# Patient Record
Sex: Male | Born: 1985
Health system: Southern US, Community
[De-identification: ages and names within clinical notes are randomized; demographics above are authoritative.]

## PROBLEM LIST (undated history)

## (undated) DIAGNOSIS — B20 Human immunodeficiency virus [HIV] disease: Secondary | ICD-10-CM

## (undated) DIAGNOSIS — I10 Essential (primary) hypertension: Secondary | ICD-10-CM

## (undated) DIAGNOSIS — J45909 Unspecified asthma, uncomplicated: Secondary | ICD-10-CM

## (undated) DIAGNOSIS — M329 Systemic lupus erythematosus, unspecified: Secondary | ICD-10-CM

## (undated) DIAGNOSIS — F419 Anxiety disorder, unspecified: Secondary | ICD-10-CM

## (undated) HISTORY — DX: Essential (primary) hypertension: I10

## (undated) HISTORY — DX: Human immunodeficiency virus (HIV) disease: B20

## (undated) HISTORY — DX: Anxiety disorder, unspecified: F41.9

---

## 2014-03-25 DIAGNOSIS — F411 Generalized anxiety disorder: Secondary | ICD-10-CM | POA: Diagnosis present

## 2017-09-12 ENCOUNTER — Other Ambulatory Visit: Payer: Self-pay

## 2017-09-12 ENCOUNTER — Encounter: Payer: Self-pay | Admitting: Emergency Medicine

## 2017-09-12 ENCOUNTER — Emergency Department: Payer: BC Managed Care – PPO

## 2017-09-12 ENCOUNTER — Emergency Department
Admission: EM | Admit: 2017-09-12 | Discharge: 2017-09-13 | Disposition: A | Discharge status: Home / Self Care | Payer: BC Managed Care – PPO | Attending: Emergency Medicine | Admitting: Emergency Medicine

## 2017-09-12 DIAGNOSIS — J45909 Unspecified asthma, uncomplicated: Secondary | ICD-10-CM | POA: Insufficient documentation

## 2017-09-12 DIAGNOSIS — M545 Low back pain, unspecified: Secondary | ICD-10-CM

## 2017-09-12 HISTORY — DX: Unspecified asthma, uncomplicated: J45.909

## 2017-09-12 MED ORDER — TRAMADOL HCL 50 MG PO TABS: 50.0000 mg | ORAL_TABLET | Freq: Four times a day (QID) | ORAL | 0 refills | Status: DC | PRN

## 2017-09-12 MED ORDER — METHOCARBAMOL 750 MG PO TABS: 1500.0000 mg | ORAL_TABLET | Freq: Four times a day (QID) | ORAL | 0 refills | Status: DC

## 2017-09-12 MED ORDER — HYDROMORPHONE HCL 1 MG/ML IJ SOLN
1.0000 mg | Freq: Once | INTRAMUSCULAR | Status: AC
  Administered 2017-09-12: 1 mg via INTRAMUSCULAR
  Filled 2017-09-12: qty 1

## 2017-09-12 MED ORDER — IBUPROFEN 800 MG PO TABS: 800.0000 mg | ORAL_TABLET | Freq: Three times a day (TID) | ORAL | 0 refills | Status: DC | PRN

## 2017-09-12 MED ORDER — KETOROLAC TROMETHAMINE 60 MG/2ML IM SOLN
60.0000 mg | Freq: Once | INTRAMUSCULAR | Status: AC
  Administered 2017-09-12: 60 mg via INTRAMUSCULAR
  Filled 2017-09-12: qty 2

## 2017-09-12 NOTE — ED Triage Notes (Signed)
Pt arrived to the ED accompanied by a friend for complaints of back, leg and arm pain. Pt reports that he injured his back at work while pulling on some heavy linen and his back pain has gotten progressively worse. Pt is AOx4 in no apparent distress.
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
## 2017-09-12 NOTE — ED Notes (Signed)
See triage note.

## 2018-01-11 ENCOUNTER — Other Ambulatory Visit: Payer: BC Managed Care – PPO

## 2018-01-11 ENCOUNTER — Ambulatory Visit: Payer: BC Managed Care – PPO

## 2018-01-11 ENCOUNTER — Other Ambulatory Visit (HOSPITAL_COMMUNITY)
Admission: RE | Admit: 2018-01-11 | Discharge: 2018-01-11 | Disposition: A | Discharge status: Home / Self Care | Payer: BC Managed Care – PPO | Source: Ambulatory Visit | Attending: Infectious Disease | Admitting: Infectious Disease

## 2018-01-11 ENCOUNTER — Other Ambulatory Visit: Payer: Self-pay | Admitting: *Deleted

## 2018-01-11 DIAGNOSIS — B20 Human immunodeficiency virus [HIV] disease: Secondary | ICD-10-CM | POA: Insufficient documentation

## 2018-01-11 DIAGNOSIS — Z79899 Other long term (current) drug therapy: Secondary | ICD-10-CM

## 2018-01-11 DIAGNOSIS — Z113 Encounter for screening for infections with a predominantly sexual mode of transmission: Secondary | ICD-10-CM | POA: Diagnosis present

## 2018-01-12 LAB — URINALYSIS
BILIRUBIN URINE: NEGATIVE
GLUCOSE, UA: NEGATIVE
HGB URINE DIPSTICK: NEGATIVE
Ketones, ur: NEGATIVE
LEUKOCYTES UA: NEGATIVE
Nitrite: NEGATIVE
PH: 6.5 (ref 5.0–8.0)
Protein, ur: NEGATIVE
Specific Gravity, Urine: 1.026 (ref 1.001–1.03)

## 2018-01-12 LAB — URINE CYTOLOGY ANCILLARY ONLY
CHLAMYDIA, DNA PROBE: NEGATIVE
Neisseria Gonorrhea: NEGATIVE

## 2018-01-12 LAB — T-HELPER CELL (CD4) - (RCID CLINIC ONLY)
CD4 T CELL HELPER: 11 % — AB (ref 33–55)
CD4 T Cell Abs: 330 /uL — ABNORMAL LOW (ref 400–2700)

## 2018-01-14 LAB — QUANTIFERON-TB GOLD PLUS
Mitogen-NIL: >10 IU/mL
NIL: 0.04 [IU]/mL
QuantiFERON-TB Gold Plus: NEGATIVE
TB1-NIL: 0 IU/mL
TB2-NIL: 0 IU/mL

## 2018-01-15 LAB — LIPID PANEL
Cholesterol: 181 mg/dL (ref <200)
HDL: 45 mg/dL (ref >40)
LDL CHOLESTEROL (CALC): 105 mg/dL — AB
Non-HDL Cholesterol (Calc): 136 mg/dL (calc) — ABNORMAL HIGH (ref <130)
TRIGLYCERIDES: 193 mg/dL — AB (ref <150)
Total CHOL/HDL Ratio: 4 (calc) (ref <5.0)

## 2018-01-15 LAB — CBC WITH DIFFERENTIAL/PLATELET
BASOS ABS: 19 {cells}/uL (ref 0–200)
Basophils Relative: 0.3 %
EOS ABS: 50 {cells}/uL (ref 15–500)
Eosinophils Relative: 0.8 %
HCT: 46 % (ref 38.5–50.0)
Hemoglobin: 15.1 g/dL (ref 13.2–17.1)
Lymphs Abs: 2802 cells/uL (ref 850–3900)
MCH: 28.4 pg (ref 27.0–33.0)
MCHC: 32.8 g/dL (ref 32.0–36.0)
MCV: 86.5 fL (ref 80.0–100.0)
MONOS PCT: 16.1 %
MPV: 12.7 fL — ABNORMAL HIGH (ref 7.5–12.5)
Neutro Abs: 2331 cells/uL (ref 1500–7800)
Neutrophils Relative %: 37.6 %
Platelets: 142 10*3/uL (ref 140–400)
RBC: 5.32 10*6/uL (ref 4.20–5.80)
RDW: 12.8 % (ref 11.0–15.0)
TOTAL LYMPHOCYTE: 45.2 %
WBC mixed population: 998 cells/uL — ABNORMAL HIGH (ref 200–950)
WBC: 6.2 10*3/uL (ref 3.8–10.8)

## 2018-01-15 LAB — HLA B*5701: HLA-B*5701 w/rflx HLA-B High: NEGATIVE

## 2018-01-15 LAB — COMPLETE METABOLIC PANEL WITH GFR
AG RATIO: 1.1 (calc) (ref 1.0–2.5)
ALBUMIN MSPROF: 4.3 g/dL (ref 3.6–5.1)
ALT: 15 U/L (ref 9–46)
AST: 17 U/L (ref 10–40)
Alkaline phosphatase (APISO): 99 U/L (ref 40–115)
BILIRUBIN TOTAL: 0.3 mg/dL (ref 0.2–1.2)
BUN: 11 mg/dL (ref 7–25)
CO2: 27 mmol/L (ref 20–32)
CREATININE: 1.08 mg/dL (ref 0.60–1.35)
Calcium: 9.7 mg/dL (ref 8.6–10.3)
Chloride: 102 mmol/L (ref 98–110)
GFR, Est African American: 106 mL/min/{1.73_m2} (ref >=60)
GFR, Est Non African American: 92 mL/min/{1.73_m2} (ref >=60)
GLOBULIN: 3.9 g/dL — AB (ref 1.9–3.7)
Glucose, Bld: 93 mg/dL (ref 65–99)
POTASSIUM: 4.4 mmol/L (ref 3.5–5.3)
SODIUM: 139 mmol/L (ref 135–146)
Total Protein: 8.2 g/dL — ABNORMAL HIGH (ref 6.1–8.1)

## 2018-01-15 LAB — HIV ANTIBODY (ROUTINE TESTING W REFLEX): HIV: REACTIVE — AB

## 2018-01-15 LAB — HEPATITIS C ANTIBODY
HEP C AB: NONREACTIVE
SIGNAL TO CUT-OFF: 0.15 (ref <1.00)

## 2018-01-15 LAB — HEPATITIS A ANTIBODY, TOTAL: HEPATITIS A AB,TOTAL: NONREACTIVE

## 2018-01-15 LAB — HEPATITIS B SURFACE ANTIBODY,QUALITATIVE: HEP B S AB: REACTIVE — AB

## 2018-01-15 LAB — RPR: RPR Ser Ql: NONREACTIVE

## 2018-01-15 LAB — HEPATITIS B SURFACE ANTIGEN: Hepatitis B Surface Ag: NONREACTIVE

## 2018-01-15 LAB — HIV-1/2 AB - DIFFERENTIATION
HIV-1 antibody: POSITIVE — AB
HIV-2 Ab: NEGATIVE

## 2018-01-15 LAB — HEPATITIS B CORE ANTIBODY, TOTAL: Hep B Core Total Ab: NONREACTIVE

## 2018-01-23 LAB — HIV-1 RNA ULTRAQUANT REFLEX TO GENTYP+
HIV 1 RNA Quant: 372000 copies/mL — ABNORMAL HIGH
HIV-1 RNA Quant, Log: 5.57 Log copies/mL — ABNORMAL HIGH

## 2018-01-23 LAB — HIV-1 GENOTYPE: HIV-1 Genotype: DETECTED — AB

## 2018-01-25 ENCOUNTER — Ambulatory Visit (INDEPENDENT_AMBULATORY_CARE_PROVIDER_SITE_OTHER): Payer: BC Managed Care – PPO | Admitting: Infectious Diseases

## 2018-01-25 ENCOUNTER — Encounter: Payer: Self-pay | Admitting: Infectious Disease

## 2018-01-25 ENCOUNTER — Ambulatory Visit (INDEPENDENT_AMBULATORY_CARE_PROVIDER_SITE_OTHER): Payer: BC Managed Care – PPO | Admitting: Pharmacist

## 2018-01-25 VITALS — BP 134/84 | HR 68 | Temp 97.8°F | Wt 178.0 lb

## 2018-01-25 DIAGNOSIS — R7989 Other specified abnormal findings of blood chemistry: Secondary | ICD-10-CM | POA: Diagnosis not present

## 2018-01-25 DIAGNOSIS — G43909 Migraine, unspecified, not intractable, without status migrainosus: Secondary | ICD-10-CM | POA: Diagnosis not present

## 2018-01-25 DIAGNOSIS — B20 Human immunodeficiency virus [HIV] disease: Secondary | ICD-10-CM

## 2018-01-25 DIAGNOSIS — Z0000 Encounter for general adult medical examination without abnormal findings: Secondary | ICD-10-CM | POA: Insufficient documentation

## 2018-01-25 DIAGNOSIS — Z21 Asymptomatic human immunodeficiency virus [HIV] infection status: Secondary | ICD-10-CM

## 2018-01-25 DIAGNOSIS — J45909 Unspecified asthma, uncomplicated: Secondary | ICD-10-CM | POA: Insufficient documentation

## 2018-01-25 DIAGNOSIS — J4530 Mild persistent asthma, uncomplicated: Secondary | ICD-10-CM

## 2018-01-25 DIAGNOSIS — I10 Essential (primary) hypertension: Secondary | ICD-10-CM

## 2018-01-25 HISTORY — DX: Human immunodeficiency virus (HIV) disease: B20

## 2018-01-25 MED ORDER — ABACAVIR-DOLUTEGRAVIR-LAMIVUD 600-50-300 MG PO TABS: 1.0000 | ORAL_TABLET | Freq: Every day | ORAL | 5 refills | Status: DC

## 2018-01-25 MED FILL — TRIUMEQ 600-50-300 MG TABS: 600-50-300 | 30 days supply | Qty: 30 | Fill #0 | Status: TO

## 2018-01-25 NOTE — Progress Notes (Signed)
HPI: Mark Washington is a 32 y.o. male who presents to the [facility] clinic today to initiate care with Judeth Cornfield for his HIV infection.  Patient Active Problem List   Diagnosis Date Noted  . HIV (human immunodeficiency virus infection) (HCC) 01/25/2018  . Low vitamin D level 01/25/2018  . Hypertension 01/25/2018  . Migraines 01/25/2018    Patient's Medications  New Prescriptions   ABACAVIR-DOLUTEGRAVIR-LAMIVUDINE (TRIUMEQ) 600-50-300 MG TABLET    Take 1 tablet by mouth daily.  Previous Medications   LISINOPRIL (PRINIVIL,ZESTRIL) 10 MG TABLET    Take 20 mg by mouth daily.   METHOCARBAMOL (ROBAXIN-750) 750 MG TABLET    Take 2 tablets (1,500 mg total) by mouth 4 (four) times daily.   TRAMADOL (ULTRAM) 50 MG TABLET    Take 1 tablet (50 mg total) by mouth every 6 (six) hours as needed.  Modified Medications   No medications on file  Discontinued Medications   No medications on file    Allergies: Allergies  Allergen Reactions  . Shellfish Allergy Anaphylaxis    Past Medical History: Past Medical History:  Diagnosis Date  . Anxiety   . Asthma   . HIV (human immunodeficiency virus infection) (HCC) 01/25/2018  . Hypertension     Social History: Social History   Socioeconomic History  . Marital status: Single    Spouse name: Not on file  . Number of children: Not on file  . Years of education: Not on file  . Highest education level: Not on file  Occupational History  . Not on file  Social Needs  . Financial resource strain: Not on file  . Food insecurity:    Worry: Not on file    Inability: Not on file  . Transportation needs:    Medical: Not on file    Non-medical: Not on file  Tobacco Use  . Smoking status: Never Smoker  . Smokeless tobacco: Never Used  Substance and Sexual Activity  . Alcohol use: No    Frequency: Never  . Drug use: No  . Sexual activity: Never    Birth control/protection: None  Lifestyle  . Physical activity:    Days per week: Not on file     Minutes per session: Not on file  . Stress: Not on file  Relationships  . Social connections:    Talks on phone: Not on file    Gets together: Not on file    Attends religious service: Not on file    Active member of club or organization: Not on file    Attends meetings of clubs or organizations: Not on file    Relationship status: Not on file  Other Topics Concern  . Not on file  Social History Narrative  . Not on file    Labs: Lab Results  Component Value Date   HIV1RNAQUANT 372,000 (H) 01/11/2018   CD4TABS 330 (L) 01/11/2018    RPR and STI Lab Results  Component Value Date   LABRPR NON-REACTIVE 01/11/2018    STI Results GC CT  01/11/2018 Negative Negative    Hepatitis B Lab Results  Component Value Date   HEPBSAB REACTIVE (A) 01/11/2018   HEPBSAG NON-REACTIVE 01/11/2018   HEPBCAB NON-REACTIVE 01/11/2018   Hepatitis C Lab Results  Component Value Date   HEPCAB NON-REACTIVE 01/11/2018   Hepatitis A Lab Results  Component Value Date   HAV NON-REACTIVE 01/11/2018   Lipids: Lab Results  Component Value Date   CHOL 181 01/11/2018   TRIG 193 (H)  01/11/2018   HDL 45 01/11/2018   CHOLHDL 4.0 01/11/2018   LDLCALC 105 (H) 01/11/2018    Current HIV Regimen: Triumeq (has been off for several months)  Assessment: Mark Washington comes in today to initiate care for his HIV infection with Judeth CornfieldStephanie, our ID NP. He previously took Stribild and Triumeq but has been off of therapy for several months.  He would like to continue Triumeq.  Patient has no questions or concerns regarding Triumeq today.  He has [employer] insurance and [facility]. Kathie RhodesBetty activated a co-pay card for him and his co-pay is $0. I gave him my card and told him to call me ASAP with any issues.  Plan: - Restart Triumeq PO once daily - [facility]  Cassie L. Kuppelweiser, PharmD, AAHIVP, CPP Infectious Diseases Clinical Pharmacist [facility] 01/25/2018,  11:00 AM

## 2018-01-25 NOTE — Assessment & Plan Note (Signed)
Discussed continuing with current inhalers vs adding additional prednisone. Discussed how it can further lower immune response and advised to modify environment and avoid triggers.

## 2018-01-25 NOTE — Assessment & Plan Note (Addendum)
Patient with HIV on HAART since 2015 previously well maintained and suppressed on Triumeq prior to medication lapse. Reviewed recent HIV labs and CD4 of 330 - no OI prophylaxis needed. We discussed Triumeq as well as some alternative STRs considering family history of heart disease. He would like to continue on Triumeq for now as he knows it works well for him and does not cause unpleasant S/E. [facility]. Provided with copay assistance today. Discussed U=U concept in addition to safe sex counseling and prevention of other STIs.  Declined STI testing today. Introduced him to our pharmacy team and discussed integrated focus of clinic. No barriers to care regarding assessment of transportation/food/resources.  He can return in 3 months to reassess VL response.

## 2018-01-25 NOTE — Patient Instructions (Signed)
Nice to meet you today!  Will get you back on your Triumeq and have you return in 3 months to check in with blood work at this visit.   No need for any vaccines today unless you are interested in the HPV vaccines --> I would call your insurer to see about the cost. Happy to give them to you (info below).   Continue your inhalers daily to help you asthma.   Please sign up with MyChart to access your labs and set up email communication with our clinic for non-urgent medical concerns.     Human Papillomavirus Human papillomavirus (HPV) is the most common sexually transmitted infection (STI). It is easy to pass it from person to person (contagious). HPV can cause cervical cancer, anal cancer, and genital warts. The genital warts can be seen and felt. Also, there may be wartlike regions in the throat. HPV may not have any symptoms. It is possible to have HPV for a long time and not know it. You may pass HPV on to others without knowing it. Follow these instructions at home:  Take medicines as told by your doctor.  Use over-the-counter creams for itching as told by your doctor.  Keep all follow-up visits. Make sure to get Pap tests as told by your doctor.  Do not touch or scratch the warts.  Do not treat genital warts with medicines used for treating hand warts.  Do not have sex while you are getting treatment.  Do not douche or use tampons during treatment of HPV.  Tell your sex partner about your infection because he or she may also need treatment.  If you get pregnant, tell your doctor that you had HPV. Your doctor will watch your pregnancy closely. This is important to keep your baby safe.  After treatment, use condoms during sex to prevent future infections.  Have only one sex partner.  Have a sex partner who does not have other sex partners. Contact a doctor if:  The treated skin is red, swollen, or painful.  You have a fever.  You feel ill.  You feel lumps or  pimple-like areas in and around your genital area.  You have bleeding of the vagina or the area that was treated.  You have pain during sex. This information is not intended to replace advice given to you by your health care provider. Make sure you discuss any questions you have with your health care provider. Document Released: 06/03/2008 Document Revised: 11/27/2015 Document Reviewed: 09/26/2013 Elsevier Interactive Patient Education  2017 Elsevier Inc.   HPV (Human Papillomavirus) Vaccine: What You Need to Know 1. Why get vaccinated? HPV vaccine prevents infection with human papillomavirus (HPV) types that are associated with many cancers, including:  cervical cancer in females,  vaginal and vulvar cancers in females,  anal cancer in females and males,  throat cancer in females and males, and  penile cancer in males.  In addition, HPV vaccine prevents infection with HPV types that cause genital warts in both females and males. In the [place] about 12,000 women get cervical cancer every year, and about 4,000 women die from it. HPV vaccine can prevent most of these cases of cervical cancer. Vaccination is not a substitute for cervical cancer screening. This vaccine does not protect against all HPV types that can cause cervical cancer. Women should still get regular Pap tests. HPV infection usually comes from sexual contact, and most people will become infected at some point in their life. About 14 million  Americans, including teens, get infected every year. Most infections will go away on their own and not cause serious problems. But thousands of women and men get cancer and other diseases from HPV. 2. HPV vaccine HPV vaccine is approved by FDA and is recommended by CDC for both males and females. It is routinely given at 35 or 30 years of age, but it may be given beginning at age 34 years through age 2 years. Most adolescents 9 through 30 years of age should get HPV vaccine as a  two-dose series with the doses separated by 6-12 months. People who start HPV vaccination at 81 years of age and older should get the vaccine as a three-dose series with the second dose given 1-2 months after the first dose and the third dose given 6 months after the first dose. There are several exceptions to these age recommendations. Your health care provider can give you more information. 3. Some people should not get this vaccine  Anyone who has had a severe (life-threatening) allergic reaction to a dose of HPV vaccine should not get another dose.  Anyone who has a severe (life threatening) allergy to any component of HPV vaccine should not get the vaccine.  Tell your doctor if you have any severe allergies that you know of, including a severe allergy to yeast.  HPV vaccine is not recommended for pregnant women. If you learn that you were pregnant when you were vaccinated, there is no reason to expect any problems for you or your baby. Any woman who learns she was pregnant when she got HPV vaccine is encouraged to contact the manufacturer's registry for HPV vaccination during pregnancy at 360-194-2782. Women who are breastfeeding may be vaccinated.  If you have a mild illness, such as a cold, you can probably get the vaccine today. If you are moderately or severely ill, you should probably wait until you recover. Your doctor can advise you. 4. Risks of a vaccine reaction With any medicine, including vaccines, there is a chance of side effects. These are usually mild and go away on their own, but serious reactions are also possible. Most people who get HPV vaccine do not have any serious problems with it. Mild or moderate problems following HPV vaccine:  Reactions in the arm where the shot was given: ? Soreness (about 9 people in 10) ? Redness or swelling (about 1 person in 3)  Fever: ? Mild (100F) (about 1 person in 10) ? Moderate (102F) (about 1 person in 17)  Other  problems: ? Headache (about 1 person in 3) Problems that could happen after any injected vaccine:  People sometimes faint after a medical procedure, including vaccination. Sitting or lying down for about 15 minutes can help prevent fainting, and injuries caused by a fall. Tell your doctor if you feel dizzy, or have vision changes or ringing in the ears.  Some people get severe pain in the shoulder and have difficulty moving the arm where a shot was given. This happens very rarely.  Any medication can cause a severe allergic reaction. Such reactions from a vaccine are very rare, estimated at about 1 in a million doses, and would happen within a few minutes to a few hours after the vaccination. As with any medicine, there is a very remote chance of a vaccine causing a serious injury or death. The safety of vaccines is always being monitored. For more information, visit: http://floyd.org/. 5. What if there is a serious reaction? What should I  look for? Look for anything that concerns you, such as signs of a severe allergic reaction, very high fever, or unusual behavior. Signs of a severe allergic reaction can include hives, swelling of the face and throat, difficulty breathing, a fast heartbeat, dizziness, and weakness. These would usually start a few minutes to a few hours after the vaccination. What should I do? If you think it is a severe allergic reaction or other emergency that can't wait, call 9-1-1 or get to the nearest hospital. Otherwise, call your doctor. Afterward, the reaction should be reported to the Vaccine Adverse Event Reporting System (VAERS). Your doctor should file this report, or you can do it yourself through the VAERS web site at www.vaers.LAgents.nohhs.gov, or by calling 1-234-341-7611. VAERS does not give medical advice. 6. The National Vaccine Injury Compensation Program The [employer] Vaccine Injury Compensation Program (VICP) is a federal program that was created to  compensate people who may have been injured by certain vaccines. Persons who believe they may have been injured by a vaccine can learn about the program and about filing a claim by calling 1-913-776-7200 or visiting the VICP website at SpiritualWord.atwww.hrsa.gov/vaccinecompensation. There is a time limit to file a claim for compensation. 7. How can I learn more?  Ask your health care provider. He or she can give you the vaccine package insert or suggest other sources of information.  Call your local or state health department.  Contact the Centers for Disease Control and Prevention (CDC): ? Call (615)499-79091-587-183-0561 (1-800-CDC-INFO) or ? Visit CDC's website at RunningConvention.dewww.cdc.gov/hpv Vaccine Information Statement, HPV Vaccine (06/06/2015) This information is not intended to replace advice given to you by your health care provider. Make sure you discuss any questions you have with your health care provider. Document Released: 01/16/2014 Document Revised: 03/11/2016 Document Reviewed: 03/11/2016 Elsevier Interactive Patient Education  2017 [employer]

## 2018-01-25 NOTE — Assessment & Plan Note (Signed)
Previously required supplementation as level was < 10. Will check for him at upcoming lab draw and recommend accordingly.

## 2018-01-25 NOTE — Assessment & Plan Note (Signed)
Well controlled on lisinopril. He has a PCP managing this.

## 2018-01-25 NOTE — Assessment & Plan Note (Signed)
Up to date on vaccines aside from meningococcal. I also recommended HPV series with new extended indication to 30yo and MSM increases exposure risk. He will discuss with insurer and see what cost burden will be for him.  Pneumovax is not due until 2021.

## 2018-01-25 NOTE — Progress Notes (Signed)
Name: Mark Washington  DOB: Nov 07, 1985 MRN: [id number] PCP: Patient, No Pcp Per   Patient Active Problem List   Diagnosis Date Noted  . HIV (human immunodeficiency virus infection) ([place]) 01/25/2018  . Low vitamin D level 01/25/2018  . Hypertension 01/25/2018  . Migraines 01/25/2018  . Health care maintenance 01/25/2018  . Asthma 01/25/2018     Brief Narrative:  Diron Leavy is a 32 y.o. male with HIV infection. Originally diagnosed with HIV in 09-2013 with CD4 nadir 107. HIV Risk: MSM. History of OIs: oral/esophageal candidiasis. Hep B/A immune. TB test negative. [id number] neg.   Previous Regimens: . Stribild 2015 --> stopped d/t GI intolerability . Triumeq 01-2014  Genotypes: . 09-2013: K103N, R-efavirenz and neviripaine per records   Subjective:   Chief Complaint  Patient presents with  . New Patient (Initial Visit)    transher  no meds for months   [facility] is here today to transfer his HIV care. He was previously in care with [facility] with Dr. Tyrell Antonio [phone], F# [phone]) for his HIV. He was taking Triumeq once daily up until several months ago - tried to get an appointment with previous provider as they would not refill but nothing available in a reasonable time frame. He is overall feeling very well and anxious to get started back on HIV medications. He works at [employer] in [employer]. Endorses MSM and uses condoms regularly. Reports no complaints today suggestive of associated opportunistic infection or advancing HIV disease such as fevers, night sweats, weight loss, anorexia, cough, SOB, nausea, vomiting, diarrhea, headache, sensory changes, lymphadenopathy or oral thrush.   In the past he has had trouble with headaches - LP and MRI unrevealing. Associated stress/anxiety that was felt to be contributing and working with psychiatry. Was started on Effexor but not taking this now. He does have asthma that he wonders  if he needs prednisone for. Takes Symbicort daily and uses albuterol infrequently. Feels that heat/environmental allergens exacerbate him the most. He has hypertension to which he is taking Lisinopril and well controlled. In 11-2017 he required emergent laparoscopic appendectomy for appendicitis with perforation; recovered well from this. Anal pap in 2015 at entry to care and negative for dysplasia.   Review of Systems  All other systems reviewed and are negative.   Past Medical History:  Diagnosis Date  . Anxiety   . Asthma   . HIV (human immunodeficiency virus infection) ([place]) 01/25/2018  . Hypertension     Outpatient Medications Prior to Visit  Medication Sig Dispense Refill  . lisinopril (PRINIVIL,ZESTRIL) 10 MG tablet Take 20 mg by mouth daily.    . methocarbamol (ROBAXIN-750) 750 MG tablet Take 2 tablets (1,500 mg total) by mouth 4 (four) times daily. 40 tablet 0  . ibuprofen (ADVIL,MOTRIN) 800 MG tablet Take 1 tablet (800 mg total) by mouth every 8 (eight) hours as needed for moderate pain. 15 tablet 0  . traMADol (ULTRAM) 50 MG tablet Take 1 tablet (50 mg total) by mouth every 6 (six) hours as needed. (Patient not taking: Reported on 01/25/2018) 20 tablet 0   No facility-administered medications prior to visit.      Allergies  Allergen Reactions  . Shellfish Allergy Anaphylaxis    Social History   Tobacco Use  . Smoking status: Never Smoker  . Smokeless tobacco: Never Used  Substance Use Topics  . Alcohol use: No    Frequency: Never  . Drug use: No    Family History  Problem Relation Age of Onset  . Diabetes Mother   . Hypertension Mother   . Asthma Mother   . Hyperlipidemia Mother   . Diabetes Father   . Thyroid disease Father   . Diabetes Maternal Grandmother   . Breast cancer Maternal Grandmother   . Anemia Maternal Grandmother   . Arthritis Maternal Grandmother   . Heart attack Paternal Grandmother   . Arthritis Paternal Grandmother   . Heart attack  Paternal Grandfather   . Diabetes Paternal Grandfather     Social History   Substance and Sexual Activity  Sexual Activity Never  . Birth control/protection: None    Objective:   Vitals:   01/25/18 0946  BP: 134/84  Pulse: 68  Temp: 97.8 F (36.6 C)  TempSrc: Oral  Weight: 178 lb (80.7 kg)   Body mass index is 30.55 kg/m.  Physical Exam  Constitutional: He is oriented to person, place, and time. He appears well-developed and well-nourished.  Very pleasant young male seated comfortably in chair during visit. Well-groomed and appropriately dressed.   HENT:  Mouth/Throat: Oropharynx is clear and moist and mucous membranes are normal. Normal dentition. No dental abscesses.  Eyes: Pupils are equal, round, and reactive to light. No scleral icterus.  Neck: Normal range of motion.  Cardiovascular: Normal rate, regular rhythm and normal heart sounds.  No murmur heard. Pulmonary/Chest: Effort normal and breath sounds normal. No respiratory distress.  Abdominal: Soft. He exhibits no distension. There is no tenderness.  Musculoskeletal: Normal range of motion.  Lymphadenopathy:    He has no cervical adenopathy.  Neurological: He is alert and oriented to person, place, and time.  Skin: Skin is warm and dry. No rash noted.  Psychiatric: He has a normal mood and affect. Judgment normal.  In good spirits today and engaged in care discussion.   Vitals reviewed.   Lab Results Lab Results  Component Value Date   WBC 6.2 01/11/2018   HGB 15.1 01/11/2018   HCT 46.0 01/11/2018   MCV 86.5 01/11/2018   PLT 142 01/11/2018    Lab Results  Component Value Date   CREATININE 1.08 01/11/2018   BUN 11 01/11/2018   NA 139 01/11/2018   K 4.4 01/11/2018   CL 102 01/11/2018   CO2 27 01/11/2018    Lab Results  Component Value Date   ALT 15 01/11/2018   AST 17 01/11/2018   BILITOT 0.3 01/11/2018    Lab Results  Component Value Date   CHOL 181 01/11/2018   HDL 45 01/11/2018    LDLCALC 105 (H) 01/11/2018   TRIG 193 (H) 01/11/2018   CHOLHDL 4.0 01/11/2018   HIV 1 RNA Quant (copies/mL)  Date Value  01/11/2018 372,000 (H)   CD4 T Cell Abs (/uL)  Date Value  01/11/2018 330 (L)     Assessment & Plan:   Problem List Items Addressed This Visit      Cardiovascular and Mediastinum   Migraines   Relevant Medications   lisinopril (PRINIVIL,ZESTRIL) 10 MG tablet   Hypertension    Well controlled on lisinopril. He has a PCP managing this.       Relevant Medications   lisinopril (PRINIVIL,ZESTRIL) 10 MG tablet     Respiratory   Asthma    Discussed continuing with current inhalers vs adding additional prednisone. Discussed how it can further lower immune response and advised to modify environment and avoid triggers.         Other   Low vitamin D level  Previously required supplementation as level was < 10. Will check for him at upcoming lab draw and recommend accordingly.       HIV (human immunodeficiency virus infection) ([place]) - Primary    Patient with HIV on HAART since 2015 previously well maintained and suppressed on Triumeq prior to medication lapse. Reviewed recent HIV labs and CD4 of 330 - no OI prophylaxis needed. We discussed Triumeq as well as some alternative STRs considering family history of heart disease. He would like to continue on Triumeq for now as he knows it works well for him and does not cause unpleasant S/E. Will send in Rx to [facility] to mail to him. Provided with copay assistance today. Discussed U=U concept in addition to safe sex counseling and prevention of other STIs.  Declined STI testing today. Introduced him to our pharmacy team and discussed integrated focus of clinic. No barriers to care regarding assessment of transportation/food/resources.  He can return in 3 months to reassess VL response.       Health care maintenance    Up to date on vaccines aside from meningococcal. I also recommended HPV series with new extended  indication to 30yo and MSM increases exposure risk. He will discuss with insurer and see what cost burden will be for him.  Pneumovax is not due until 2021.         Janene Madeira, MSN, NP-C [facility] for [facility] [place] Pager: [phone] Office: [phone]  01/25/18  9:51 PM

## 2018-02-03 ENCOUNTER — Encounter: Payer: Self-pay | Admitting: *Deleted

## 2018-02-20 ENCOUNTER — Other Ambulatory Visit: Payer: Self-pay | Admitting: *Deleted

## 2018-02-20 DIAGNOSIS — B20 Human immunodeficiency virus [HIV] disease: Secondary | ICD-10-CM

## 2018-02-20 MED ORDER — ABACAVIR-DOLUTEGRAVIR-LAMIVUD 600-50-300 MG PO TABS: 1.0000 | ORAL_TABLET | Freq: Every day | ORAL | 5 refills | Status: DC

## 2018-02-20 NOTE — Progress Notes (Signed)
Per [facility], prescription needs to be sent to [facility] due to patient's insurance policy. Andree CossHowell, Yosiah Jasmin M, RN

## 2018-04-26 ENCOUNTER — Encounter: Payer: Self-pay | Admitting: Infectious Diseases

## 2018-04-26 ENCOUNTER — Ambulatory Visit (INDEPENDENT_AMBULATORY_CARE_PROVIDER_SITE_OTHER): Payer: BC Managed Care – PPO | Admitting: Infectious Diseases

## 2018-04-26 ENCOUNTER — Ambulatory Visit: Payer: BC Managed Care – PPO | Admitting: Infectious Diseases

## 2018-04-26 VITALS — BP 140/92 | HR 78 | Temp 98.5°F | Wt 190.0 lb

## 2018-04-26 DIAGNOSIS — M5441 Lumbago with sciatica, right side: Secondary | ICD-10-CM

## 2018-04-26 DIAGNOSIS — M542 Cervicalgia: Secondary | ICD-10-CM | POA: Diagnosis not present

## 2018-04-26 DIAGNOSIS — M791 Myalgia, unspecified site: Secondary | ICD-10-CM

## 2018-04-26 DIAGNOSIS — R748 Abnormal levels of other serum enzymes: Secondary | ICD-10-CM

## 2018-04-26 DIAGNOSIS — R0602 Shortness of breath: Secondary | ICD-10-CM

## 2018-04-26 DIAGNOSIS — Z23 Encounter for immunization: Secondary | ICD-10-CM

## 2018-04-26 DIAGNOSIS — I10 Essential (primary) hypertension: Secondary | ICD-10-CM

## 2018-04-26 DIAGNOSIS — Z0000 Encounter for general adult medical examination without abnormal findings: Secondary | ICD-10-CM

## 2018-04-26 DIAGNOSIS — R7989 Other specified abnormal findings of blood chemistry: Secondary | ICD-10-CM | POA: Diagnosis not present

## 2018-04-26 DIAGNOSIS — F331 Major depressive disorder, recurrent, moderate: Secondary | ICD-10-CM

## 2018-04-26 DIAGNOSIS — B20 Human immunodeficiency virus [HIV] disease: Secondary | ICD-10-CM | POA: Diagnosis not present

## 2018-04-26 MED ORDER — DULOXETINE HCL 20 MG PO CPEP: 20.0000 mg | ORAL_CAPSULE | Freq: Every day | ORAL | 3 refills | Status: DC

## 2018-04-26 MED ORDER — TRAZODONE HCL 50 MG PO TABS: 50.0000 mg | ORAL_TABLET | Freq: Every day | ORAL | 0 refills | Status: DC
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
## 2018-04-26 NOTE — Patient Instructions (Signed)
Continue your Triumeq  Will start you on Cymbalta one pill once a day - watch for signs of worsening depression.   Will try trazodone for you at night to see if that helps you sleep better - 1-2 tablets 1hr before bed time  Neck and back x-rays for you pain - may need to do scans for you to evaluate

## 2018-04-28 ENCOUNTER — Telehealth: Payer: Self-pay

## 2018-04-28 DIAGNOSIS — M791 Myalgia, unspecified site: Secondary | ICD-10-CM | POA: Insufficient documentation

## 2018-04-28 DIAGNOSIS — F32A Depression, unspecified: Secondary | ICD-10-CM | POA: Insufficient documentation

## 2018-04-28 DIAGNOSIS — F329 Major depressive disorder, single episode, unspecified: Secondary | ICD-10-CM | POA: Insufficient documentation

## 2018-04-28 DIAGNOSIS — M5441 Lumbago with sciatica, right side: Secondary | ICD-10-CM | POA: Insufficient documentation

## 2018-04-28 DIAGNOSIS — M5412 Radiculopathy, cervical region: Secondary | ICD-10-CM | POA: Insufficient documentation

## 2018-04-28 DIAGNOSIS — R0602 Shortness of breath: Secondary | ICD-10-CM | POA: Insufficient documentation

## 2018-04-28 LAB — T-HELPER CELL (CD4) - (RCID CLINIC ONLY)
CD4 % Helper T Cell: 14 % — ABNORMAL LOW (ref 33–55)
CD4 T Cell Abs: 310 /uL — ABNORMAL LOW (ref 400–2700)

## 2018-04-28 NOTE — Assessment & Plan Note (Signed)
Will recheck HIV VL today now that he has been back on Triumeq consistently. Check CD4 with multiple complaints. He is tolerating the medication well. I worry a little that if he is having possible cardiac etiology that we should get him off the abacavir; although reassuring his work up was negative at the hospital. Continue for now.   Return in about 4 weeks (around 05/24/2018).

## 2018-04-28 NOTE — Assessment & Plan Note (Signed)
Check levels with c/o joint pains

## 2018-04-28 NOTE — Progress Notes (Signed)
Please call Mark Washington to let him know that one of his lab tests that measures muscle enzymes came back elevated. This paired with his fatigue/aches warrants work up. He needs to come back next week to check some more blood work and to do the neck x-rays we talked about if he can. The other lab work that I check for his heart function is normal. Others are still pending.   I know he has questions and so do I - the extra blood work I have ordered will help [place] interpret and determine next steps.

## 2018-04-28 NOTE — Telephone Encounter (Signed)
-----   Message from Blanchard Kelch, NP sent at 04/28/2018  8:27 AM EDT ----- Please call Mark Washington to let him know that one of his lab tests that measures muscle enzymes came back elevated. This paired with his fatigue/aches warrants work up. He needs to come back next week to check some more blood work and to do the neck x-rays we talked about if he can. The other lab work that I check for his heart function is normal. Others are still pending.   I know he has questions and so do I - the extra blood work I have ordered will help [place] interpret and determine next steps.

## 2018-04-28 NOTE — Assessment & Plan Note (Signed)
This is a relapsing problem for Mark Washington. He never quite dealt with the fact that he has HIV and still going through some period of adjustment with depressed mood. Lengthy discussion about medications to try. We decided to retrial cymbalta as there is a potential for concomitant nerve pain therapeutic benefit. Trazodone for sleep 50-100 mg QHS as needed. He will meet with Rene Kocher and begin counseling sessions. This could also be a reason he is experiencing increased myalgias as well.

## 2018-04-28 NOTE — Assessment & Plan Note (Signed)
BP Readings from Last 3 Encounters:  04/26/18 (!) 140/92  01/25/18 134/84  09/12/17 (!) 143/95   Slightly elevated today. Will see what his labs look like to see if we need to adjust anything for him.

## 2018-04-28 NOTE — Assessment & Plan Note (Signed)
HPV #1 today. He has received his flu vaccine.

## 2018-04-28 NOTE — Assessment & Plan Note (Addendum)
His exam and description of pain seems to be more muscle pain than joint pain. Will check lactic acid, CK, CMET. For now recommended OTC pain relievers.

## 2018-04-28 NOTE — Assessment & Plan Note (Signed)
Check BNP, BMET. Records reviewed from hospitalization w/o cardiopulmonary cause identified at that time. I am not certain he is having true shortness of breath or if this is more a manifestation of fatigue.

## 2018-04-28 NOTE — Assessment & Plan Note (Signed)
Spinous process tenderness over C-spine with limited ROM and pain with flexion/extension. Will check complete neck xrays today. With c/o shooting pains may need scan arranged if nothing identified on xray.

## 2018-04-28 NOTE — Telephone Encounter (Signed)
Per Judeth Cornfield, Contacted patient to notify of lab results, scheduled appointment for further labs on next Wednesday 05/03/18 at 10:30am.

## 2018-04-30 LAB — BASIC METABOLIC PANEL
BUN: 21 mg/dL (ref 7–25)
CO2: 26 mmol/L (ref 20–32)
CREATININE: 1.31 mg/dL (ref 0.60–1.35)
Calcium: 10 mg/dL (ref 8.6–10.3)
Chloride: 102 mmol/L (ref 98–110)
GLUCOSE: 91 mg/dL (ref 65–99)
POTASSIUM: 4.4 mmol/L (ref 3.5–5.3)
Sodium: 137 mmol/L (ref 135–146)

## 2018-04-30 LAB — VITAMIN D 1,25 DIHYDROXY
Vitamin D 1, 25 (OH)2 Total: 38 pg/mL (ref 18–72)
Vitamin D2 1, 25 (OH)2: <8 pg/mL
Vitamin D3 1, 25 (OH)2: 38 pg/mL

## 2018-04-30 LAB — LACTIC ACID, PLASMA: LACTIC ACID: 0.8 mmol/L (ref 0.4–1.8)

## 2018-04-30 LAB — HIV-1 RNA QUANT-NO REFLEX-BLD
HIV 1 RNA Quant: 61 copies/mL — ABNORMAL HIGH
HIV-1 RNA QUANT, LOG: 1.79 {Log_copies}/mL — AB

## 2018-04-30 LAB — BRAIN NATRIURETIC PEPTIDE

## 2018-04-30 LAB — CK: Total CK: 1924 U/L — ABNORMAL HIGH (ref 44–196)

## 2018-05-03 ENCOUNTER — Other Ambulatory Visit: Payer: BC Managed Care – PPO

## 2018-05-03 DIAGNOSIS — R748 Abnormal levels of other serum enzymes: Secondary | ICD-10-CM

## 2018-05-04 LAB — THYROID PANEL WITH TSH
FREE THYROXINE INDEX: 1.8 (ref 1.4–3.8)
T3 Uptake: 30 % (ref 22–35)
T4 TOTAL: 6 ug/dL (ref 4.9–10.5)
TSH: 0.43 m[IU]/L (ref 0.40–4.50)

## 2018-05-04 LAB — LACTATE DEHYDROGENASE: LDH: 129 U/L (ref 100–220)

## 2018-05-04 LAB — CK: Total CK: 147 U/L (ref 44–196)

## 2018-05-04 LAB — SEDIMENTATION RATE: Sed Rate: 2 mm/h (ref 0–15)

## 2018-05-05 ENCOUNTER — Other Ambulatory Visit: Payer: Self-pay | Admitting: Infectious Diseases

## 2018-05-05 DIAGNOSIS — M542 Cervicalgia: Secondary | ICD-10-CM

## 2018-05-05 LAB — DRUGS OF ABUSE SCREEN W/O ALC, ROUTINE URINE
AMPHETAMINES (1000 NG/ML SCRN): NEGATIVE
BARBITURATES: NEGATIVE
BENZODIAZEPINES: NEGATIVE
COCAINE METABOLITES: NEGATIVE
MARIJUANA MET (50 NG/ML SCRN): NEGATIVE
METHADONE: NEGATIVE
METHAQUALONE: NEGATIVE
OPIATES: NEGATIVE
PHENCYCLIDINE: NEGATIVE
PROPOXYPHENE: NEGATIVE

## 2018-05-05 LAB — URINALYSIS
Bilirubin Urine: NEGATIVE
GLUCOSE, UA: NEGATIVE
Hgb urine dipstick: NEGATIVE
Ketones, ur: NEGATIVE
Leukocytes, UA: NEGATIVE
NITRITE: NEGATIVE
PH: 7.5 (ref 5.0–8.0)
Protein, ur: NEGATIVE
SPECIFIC GRAVITY, URINE: 1.019 (ref 1.001–1.03)

## 2018-05-05 MED ORDER — PREDNISONE 10 MG PO TABS: 10.0000 mg | ORAL_TABLET | Freq: Every day | ORAL | 0 refills | Status: DC

## 2018-05-17 ENCOUNTER — Ambulatory Visit (HOSPITAL_COMMUNITY): Payer: BC Managed Care – PPO

## 2018-05-23 ENCOUNTER — Other Ambulatory Visit: Payer: Self-pay | Admitting: *Deleted

## 2018-05-23 DIAGNOSIS — F331 Major depressive disorder, recurrent, moderate: Secondary | ICD-10-CM

## 2018-05-23 MED ORDER — TRAZODONE HCL 50 MG PO TABS: 50.0000 mg | ORAL_TABLET | Freq: Every day | ORAL | 0 refills | Status: DC

## 2018-06-13 ENCOUNTER — Encounter: Payer: Self-pay | Admitting: Infectious Diseases

## 2018-06-13 ENCOUNTER — Ambulatory Visit: Payer: BC Managed Care – PPO | Admitting: Licensed Clinical Social Worker

## 2018-06-13 ENCOUNTER — Ambulatory Visit (INDEPENDENT_AMBULATORY_CARE_PROVIDER_SITE_OTHER): Payer: BC Managed Care – PPO | Admitting: Infectious Diseases

## 2018-06-13 VITALS — BP 146/95 | HR 83 | Temp 98.0°F | Ht 64.0 in | Wt 194.0 lb

## 2018-06-13 DIAGNOSIS — G43909 Migraine, unspecified, not intractable, without status migrainosus: Secondary | ICD-10-CM | POA: Diagnosis not present

## 2018-06-13 DIAGNOSIS — B20 Human immunodeficiency virus [HIV] disease: Secondary | ICD-10-CM | POA: Diagnosis not present

## 2018-06-13 DIAGNOSIS — F331 Major depressive disorder, recurrent, moderate: Secondary | ICD-10-CM

## 2018-06-13 DIAGNOSIS — M542 Cervicalgia: Secondary | ICD-10-CM | POA: Diagnosis not present

## 2018-06-13 DIAGNOSIS — M5412 Radiculopathy, cervical region: Secondary | ICD-10-CM

## 2018-06-13 MED ORDER — SUMATRIPTAN SUCCINATE 50 MG PO TABS: 50.0000 mg | ORAL_TABLET | ORAL | 0 refills | Status: DC | PRN

## 2018-06-13 MED ORDER — PREDNISONE 10 MG PO TABS: 10.0000 mg | ORAL_TABLET | Freq: Every day | ORAL | 0 refills | Status: DC

## 2018-06-13 NOTE — Assessment & Plan Note (Signed)
He had success on imitrex in the past - will provide small supply until follow up with neurology.

## 2018-06-13 NOTE — Patient Instructions (Addendum)
Nice to see you today!  Please take your prednisone as directed below:  40 mg (4 pills) once a day with food for 2 days, then  30 mg (3 pills) once a day with food for 2 days, then  20 mg (2 pills) once a day with food for 2 days, then  10 mg (1 pill) once a day with food for 4 days, then  5 mg (1/2 a pill) once a day with food for 4 days then stop.    Please schedule your CT scan of the neck before you leave. I strongly suspect a process in your neck that is pinching a nerve.   Will send in your Imitrex to have on hand for migraines since this helped you in the past. One tablet at the onset of a headache and can repeat in 2 hours if needed. Maximum 200 mg daily.   Please return in 6 weeks - will do labs at this visit.   Continue your Cymbalta and Triumeq once a day.    Cervical Radiculopathy Cervical radiculopathy means that a nerve in the neck is pinched or bruised. This can cause pain or loss of feeling (numbness) that runs from your neck to your arm and fingers. Follow these instructions at home: Managing pain  Take over-the-counter and prescription medicines only as told by your doctor.  If directed, put ice on the injured or painful area. ? Put ice in a plastic bag. ? Place a towel between your skin and the bag. ? Leave the ice on for 20 minutes, 2-3 times per day.  If ice does not help, you can try using heat. Take a warm shower or warm bath, or use a heat pack as told by your doctor.  You may try a gentle neck and shoulder massage. Activity  Rest as needed. Follow instructions from your doctor about any activities to avoid.  Do exercises as told by your doctor or physical therapist. General instructions  If you were given a soft collar, wear it as told by your doctor.  Use a flat pillow when you sleep.  Keep all follow-up visits as told by your doctor. This is important. Contact a doctor if:  Your condition does not improve with treatment. Get help right away  if:  Your pain gets worse and is not controlled with medicine.  You lose feeling or feel weak in your hand, arm, face, or leg.  You have a fever.  You have a stiff neck.  You cannot control when you poop or pee (have incontinence).  You have trouble with walking, balance, or talking. This information is not intended to replace advice given to you by your health care provider. Make sure you discuss any questions you have with your health care provider. Document Released: 06/10/2011 Document Revised: 11/27/2015 Document Reviewed: 08/15/2014 Elsevier Interactive Patient Education  [employer]2018 Elsevier Inc.

## 2018-06-13 NOTE — Assessment & Plan Note (Signed)
Exam/history of trauma and current symptoms are suspicious for disc etiology/structural abnormality. Will re-prescribe prednisone course for him and obtain MR of the cervical spine w/o contrast for treatment planning. He has neurology appt in 1 month.

## 2018-06-13 NOTE — Assessment & Plan Note (Signed)
Well controlled on Cymbalta. He will need to reschedule his appointment with Rene Kocheregina today - I asked him to please try a few sessions with her to help with bimodal treatment of his depression. He will. Continue cymbalta at current dose

## 2018-06-13 NOTE — Progress Notes (Signed)
Name: Mark Washington  DOB: [birth year] MRN: [id number] PCP: Patient, No Pcp Per   Patient Active Problem List   Diagnosis Date Noted  . Muscle pain 04/28/2018  . Shortness of breath 04/28/2018  . Cervical radicular pain 04/28/2018  . Right-sided low back pain with sciatica 04/28/2018  . Depression 04/28/2018  . HIV disease ([place]) 01/25/2018  . Low vitamin D level 01/25/2018  . Hypertension 01/25/2018  . Migraines 01/25/2018  . Health care maintenance 01/25/2018  . Asthma 01/25/2018     Brief Narrative:  Mark Washington is a 32 y.o. male with HIV infection. Originally diagnosed with HIV in 09-2013 with CD4 nadir 107. HIV Risk: MSM. History of OIs: oral/esophageal candidiasis. Hep B/A immune. TB test negative (2019). PVV*[id number] neg. He was previously in care with [facility] with Dr. Tyrell Antonio [phone], F# [phone]) for his HIV.  Previous Regimens: . Stribild 2015 --> stopped d/t GI intolerability . Triumeq 01-2014  Genotypes: . 09-2013: K103N, R-efavirenz and neviripaine per records   Subjective:  CC:  Mark Washington is here for follow up on his HIV care. C/O migraines and RUE pain/weakness.   HPI:  Was doing well and improved following prednisone taper up until Saturday of this week when he suddenly experienced sharp pains in his right hand arm. He was not able to work and was sent home yesterday due to the weakness/pain in his hand. Has also intermittently noticed his right leg will be weak, however this is not as often as hand pain/numbness. He reports that the prednisone and prn robaxin at night has helped significantly. No fevers, chills. No muscle aches just weakness. No interval ER/hospital visits since our last appointment. Reports Has been feeling well up until Saturday - unable to use right hand.   Continues on Triumeq daily and reports 100% adherence to doses. He was experiencing some nausea when he took the medicine however now  that he takes it at night this resolved. He feels that his depression has been under much better control with addition of Cymbalta. He is much more calm and family/friends have noticed good improvement. Happy with the dose he is at currently.   Requesting assistance with FMLA for job protection until neurology appt to assist with identifying why he is having his physical weakness/limitations.   Review of Systems  All other systems reviewed and are negative.   Past Medical History:  Diagnosis Date  . Anxiety   . Asthma   . HIV (human immunodeficiency virus infection) ([place]) 01/25/2018  . Hypertension     Outpatient Medications Prior to Visit  Medication Sig Dispense Refill  . abacavir-dolutegravir-lamiVUDine (TRIUMEQ) 600-50-300 MG tablet Take 1 tablet by mouth daily. 30 tablet 5  . DULoxetine (CYMBALTA) 20 MG capsule Take 1 capsule (20 mg total) by mouth daily. 30 capsule 3  . lisinopril (PRINIVIL,ZESTRIL) 10 MG tablet Take 20 mg by mouth daily.    . methocarbamol (ROBAXIN-750) 750 MG tablet Take 2 tablets (1,500 mg total) by mouth 4 (four) times daily. 40 tablet 0  . traZODone (DESYREL) 50 MG tablet Take 1-2 tablets (50-100 mg total) by mouth at bedtime. 45 tablet 0  . predniSONE (DELTASONE) 10 MG tablet Take 1 tablet (10 mg total) by mouth daily with breakfast. 30 tablet 0  . traMADol (ULTRAM) 50 MG tablet Take 1 tablet (50 mg total) by mouth every 6 (six) hours as needed. 20 tablet 0   No facility-administered medications prior to visit.  Allergies  Allergen Reactions  . Iodine Anaphylaxis  . Shellfish Allergy Anaphylaxis  . Oxycodone-Acetaminophen Itching and Rash    Social History   Tobacco Use  . Smoking status: Never Smoker  . Smokeless tobacco: Never Used  Substance Use Topics  . Alcohol use: No    Frequency: Never  . Drug use: No    Social History   Substance and Sexual Activity  Sexual Activity Never  . Birth control/protection: None    Objective:    Vitals:   06/13/18 1403  BP: (!) 146/95  Pulse: 83  Temp: 98 F (36.7 C)  Weight: 194 lb (88 kg)  Height: _0  (1.626 m)   Body mass index is 33.3 kg/m.  Physical Exam  Constitutional: He is oriented to person, place, and time. He appears well-developed and well-nourished.  Seated in chair. Appears uncomfortable today, constantly massaging right hand/forearm.   HENT:  Mouth/Throat: Mucous membranes are normal. Normal dentition. No dental abscesses.  Eyes: Pupils are equal, round, and reactive to light. No scleral icterus.  Neck: Spinous process tenderness and muscular tenderness present. No neck rigidity. Decreased range of motion present. No edema present.  Cardiovascular: Normal rate, regular rhythm and normal heart sounds.  No murmur heard. Pulmonary/Chest: Effort normal and breath sounds normal. No respiratory distress.  Abdominal: Soft. He exhibits no distension. There is no tenderness.  Neurological: He is alert and oriented to person, place, and time.  Skin: Skin is warm and dry. No rash noted.  Psychiatric: He has a normal mood and affect. Judgment normal.  He does not exhibit depressed/withrawn mood today. Smiling and in good spirits. Frustrated about limitations/pain.   Vitals reviewed.   Lab Results Lab Results  Component Value Date   WBC 6.2 01/11/2018   HGB 15.1 01/11/2018   HCT 46.0 01/11/2018   MCV 86.5 01/11/2018   PLT 142 01/11/2018    Lab Results  Component Value Date   CREATININE 1.31 04/26/2018   BUN 21 04/26/2018   NA 137 04/26/2018   K 4.4 04/26/2018   CL 102 04/26/2018   CO2 26 04/26/2018    Lab Results  Component Value Date   ALT 15 01/11/2018   AST 17 01/11/2018   BILITOT 0.3 01/11/2018    Lab Results  Component Value Date   CHOL 181 01/11/2018   HDL 45 01/11/2018   LDLCALC 105 (H) 01/11/2018   TRIG 193 (H) 01/11/2018   CHOLHDL 4.0 01/11/2018   HIV 1 RNA Quant (copies/mL)  Date Value  04/26/2018 61 (H)  01/11/2018 372,000  (H)   CD4 T Cell Abs (/uL)  Date Value  04/26/2018 310 (L)  01/11/2018 330 (L)     Assessment & Plan:   Problem List Items Addressed This Visit      Unprioritized   Cervical radicular pain    Exam/history of trauma and current symptoms are suspicious for disc etiology/structural abnormality. Will re-prescribe prednisone course for him and obtain MR of the cervical spine w/o contrast for treatment planning. He has neurology appt in 1 month.       Relevant Medications   predniSONE (DELTASONE) 10 MG tablet   Depression - Primary    Well controlled on Cymbalta. He will need to reschedule his appointment with Rollene Fare today - I asked him to please try a few sessions with her to help with bimodal treatment of his depression. He will. Continue cymbalta at current dose      HIV disease ([place])  Reports excellent adherence with triumeq with improved nausea while taking at night now. Will check VL next office visit in 6 weeks. Reviewed labs with him today and counseled.       Migraines    He had success on imitrex in the past - will provide small supply until follow up with neurology.       Relevant Medications   SUMAtriptan (IMITREX) 50 MG tablet     Janene Madeira, MSN, NP-C [facility] [place] Pager: [phone] Office: [phone]  06/13/18  3:20 PM

## 2018-06-13 NOTE — Progress Notes (Signed)
Letter provided for work through 06/14/18. Letter signed by NP.

## 2018-06-13 NOTE — Assessment & Plan Note (Addendum)
Reports excellent adherence with triumeq with improved nausea while taking at night now. Will check VL next office visit in 6 weeks. Reviewed labs with him today and counseled.

## 2018-06-14 ENCOUNTER — Telehealth: Payer: Self-pay | Admitting: Infectious Diseases

## 2018-06-14 NOTE — Telephone Encounter (Signed)
Discussed need for MRI cervical spine with RN reviewer. Authorization of [id number] was provided, valid from 06/14/18 - 07/13/2018.

## 2018-06-21 ENCOUNTER — Telehealth: Payer: Self-pay | Admitting: Behavioral Health

## 2018-06-21 NOTE — Telephone Encounter (Signed)
Called patient back and informed him per Rexene AlbertsStephanie Dixon that the Physician portion of the  [facility] has been faxed.  Patient was appreciative and verbalized understanding.  Patient states he is not feeling better since taking the prednisone.  He states he now has pain all over.  He states he comes home from work and gets in the bed every night because he is unable to do anything else. Angeline SlimAshley Kaison Mcparland RN

## 2018-06-21 NOTE — Telephone Encounter (Signed)
I hate that - thank you for the update. He has a scan tomorrow and an upcoming appointment with neurology and I am hopeful they can help identify a cause for his symptoms.

## 2018-06-21 NOTE — Telephone Encounter (Signed)
Patient called this morning concerned because he states his job is in the process of firing him if he does not get FMLA paper work filled out out.  Patient was calling to chek the status.  He states paperwork was provided at his last office visit 06/13/2018. Angeline SlimAshley Aycen Porreca RN

## 2018-06-21 NOTE — Telephone Encounter (Addendum)
Physician portion of FMLA paperwork faxed to [employer] [employer] @ [phone].   Please let Yarnell know and if you don't mind ask him how he is doing on the prednisone I gave him. Hopeful it has helped!   Thank you Morrie SheldonAshley.

## 2018-06-22 ENCOUNTER — Ambulatory Visit (HOSPITAL_COMMUNITY)
Admission: RE | Admit: 2018-06-22 | Discharge: 2018-06-22 | Disposition: A | Discharge status: Home / Self Care | Payer: BC Managed Care – PPO | Source: Ambulatory Visit | Attending: Infectious Diseases | Admitting: Infectious Diseases

## 2018-06-22 DIAGNOSIS — M542 Cervicalgia: Secondary | ICD-10-CM | POA: Diagnosis present

## 2018-06-23 NOTE — Progress Notes (Signed)
Normal MRI and does not explain symptoms that Mark Washington has been experiencing. Neurology appointment pending. Results communicated via MyChart.

## 2018-06-26 ENCOUNTER — Ambulatory Visit: Payer: BC Managed Care – PPO

## 2018-07-14 ENCOUNTER — Ambulatory Visit: Payer: BC Managed Care – PPO | Admitting: Diagnostic Neuroimaging

## 2018-07-14 ENCOUNTER — Encounter: Payer: Self-pay | Admitting: Diagnostic Neuroimaging

## 2018-07-14 VITALS — BP 171/103 | HR 79 | Temp 98.1°F | Ht 64.0 in | Wt 196.4 lb

## 2018-07-14 DIAGNOSIS — R200 Anesthesia of skin: Secondary | ICD-10-CM

## 2018-07-14 DIAGNOSIS — M545 Low back pain, unspecified: Secondary | ICD-10-CM

## 2018-07-14 DIAGNOSIS — B20 Human immunodeficiency virus [HIV] disease: Secondary | ICD-10-CM

## 2018-07-14 DIAGNOSIS — G43009 Migraine without aura, not intractable, without status migrainosus: Secondary | ICD-10-CM

## 2018-07-14 DIAGNOSIS — M542 Cervicalgia: Secondary | ICD-10-CM | POA: Diagnosis not present

## 2018-07-14 DIAGNOSIS — R202 Paresthesia of skin: Secondary | ICD-10-CM
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
## 2018-07-14 NOTE — Patient Instructions (Signed)
  NUMBNESS / PAIN - MRI brain - NCV/EMG; then may check addl labs

## 2018-07-17 ENCOUNTER — Telehealth: Payer: Self-pay | Admitting: Diagnostic Neuroimaging

## 2018-07-17 NOTE — Telephone Encounter (Signed)
BCBS Auth: [id number] (exp. 07/17/18 to 08/15/18).  Due to the cost of the exam he wants to hold off right now. I informed him whenever he is ready to give me a call and we can get him on the schedule.

## 2018-07-18 ENCOUNTER — Other Ambulatory Visit: Payer: Self-pay | Admitting: Infectious Diseases

## 2018-07-18 DIAGNOSIS — F331 Major depressive disorder, recurrent, moderate: Secondary | ICD-10-CM

## 2018-07-20 ENCOUNTER — Encounter: Payer: BC Managed Care – PPO | Admitting: Diagnostic Neuroimaging

## 2018-07-21 ENCOUNTER — Ambulatory Visit: Payer: BC Managed Care – PPO | Admitting: Infectious Diseases

## 2018-08-11 ENCOUNTER — Other Ambulatory Visit: Payer: Self-pay | Admitting: Infectious Diseases

## 2018-08-11 DIAGNOSIS — M2550 Pain in unspecified joint: Secondary | ICD-10-CM

## 2018-08-11 DIAGNOSIS — M353 Polymyalgia rheumatica: Secondary | ICD-10-CM

## 2018-08-11 NOTE — Progress Notes (Signed)
Patient sent mychart reporting ongoing pain unrelieved by prednisone. Partial relief with gabapentin. Saw neurology and recommended consideration for rheumatology evaluation, which I agree with.   Will place referral for rheumatology to evaluate polymyalgias and joint pains. Will increase gabapentin for him.   Rexene Alberts, MSN, NP-C [facility]  [email or web address] Pager: [phone] Office: [phone]

## 2018-08-11 NOTE — Addendum Note (Signed)
Addended by: Blanchard Kelch on: 08/11/2018 04:52 PM   Modules accepted: Orders

## 2018-12-29 ENCOUNTER — Other Ambulatory Visit: Payer: Self-pay | Admitting: Infectious Diseases

## 2018-12-29 DIAGNOSIS — B20 Human immunodeficiency virus [HIV] disease: Secondary | ICD-10-CM

## 2019-01-01 NOTE — Telephone Encounter (Signed)
Called Mark Washington in attempt to make a follow appointment since he failed to follow up in January.   **His refill request prompted me to look at missed appointment.   Left Mark Washington a voicemail to give office back to schedule follow up appointment.     Lenore Cordia, [place]

## 2019-02-06 ENCOUNTER — Other Ambulatory Visit: Payer: Self-pay | Admitting: *Deleted

## 2019-02-06 DIAGNOSIS — Z113 Encounter for screening for infections with a predominantly sexual mode of transmission: Secondary | ICD-10-CM

## 2019-02-06 DIAGNOSIS — B20 Human immunodeficiency virus [HIV] disease: Secondary | ICD-10-CM

## 2019-02-07 ENCOUNTER — Other Ambulatory Visit: Payer: Self-pay

## 2019-02-07 ENCOUNTER — Other Ambulatory Visit: Payer: BC Managed Care – PPO

## 2019-02-07 DIAGNOSIS — B20 Human immunodeficiency virus [HIV] disease: Secondary | ICD-10-CM

## 2019-02-07 DIAGNOSIS — Z113 Encounter for screening for infections with a predominantly sexual mode of transmission: Secondary | ICD-10-CM

## 2019-02-08 LAB — T-HELPER CELL (CD4) - (RCID CLINIC ONLY)
CD4 % Helper T Cell: 17 % — ABNORMAL LOW (ref 33–65)
CD4 T Cell Abs: 338 /uL — ABNORMAL LOW (ref 400–1790)

## 2019-02-14 ENCOUNTER — Other Ambulatory Visit: Payer: Self-pay | Admitting: Infectious Diseases

## 2019-02-14 DIAGNOSIS — B20 Human immunodeficiency virus [HIV] disease: Secondary | ICD-10-CM

## 2019-02-14 LAB — COMPLETE METABOLIC PANEL WITH GFR
AG Ratio: 1.6 (calc) (ref 1.0–2.5)
ALT: 14 U/L (ref 9–46)
AST: 15 U/L (ref 10–40)
Albumin: 4.6 g/dL (ref 3.6–5.1)
Alkaline phosphatase (APISO): 64 U/L (ref 36–130)
BUN/Creatinine Ratio: 10 (calc) (ref 6–22)
BUN: 14 mg/dL (ref 7–25)
CO2: 28 mmol/L (ref 20–32)
Calcium: 10 mg/dL (ref 8.6–10.3)
Chloride: 103 mmol/L (ref 98–110)
Creat: 1.43 mg/dL — ABNORMAL HIGH (ref 0.60–1.35)
GFR, Est African American: 75 mL/min/{1.73_m2} (ref >=60)
GFR, Est Non African American: 65 mL/min/{1.73_m2} (ref >=60)
Globulin: 2.8 g/dL (calc) (ref 1.9–3.7)
Glucose, Bld: 85 mg/dL (ref 65–99)
Potassium: 4.3 mmol/L (ref 3.5–5.3)
Sodium: 139 mmol/L (ref 135–146)
Total Bilirubin: 0.5 mg/dL (ref 0.2–1.2)
Total Protein: 7.4 g/dL (ref 6.1–8.1)

## 2019-02-14 LAB — CBC WITH DIFFERENTIAL/PLATELET
Absolute Monocytes: 686 cells/uL (ref 200–950)
Basophils Absolute: 29 cells/uL (ref 0–200)
Basophils Relative: 0.6 %
Eosinophils Absolute: 82 cells/uL (ref 15–500)
Eosinophils Relative: 1.7 %
HCT: 44.7 % (ref 38.5–50.0)
Hemoglobin: 15.1 g/dL (ref 13.2–17.1)
Lymphs Abs: 2285 cells/uL (ref 850–3900)
MCH: 29.7 pg (ref 27.0–33.0)
MCHC: 33.8 g/dL (ref 32.0–36.0)
MCV: 88 fL (ref 80.0–100.0)
MPV: 12.4 fL (ref 7.5–12.5)
Monocytes Relative: 14.3 %
Neutro Abs: 1718 cells/uL (ref 1500–7800)
Neutrophils Relative %: 35.8 %
Platelets: 142 10*3/uL (ref 140–400)
RBC: 5.08 10*6/uL (ref 4.20–5.80)
RDW: 13.3 % (ref 11.0–15.0)
Total Lymphocyte: 47.6 %
WBC: 4.8 10*3/uL (ref 3.8–10.8)

## 2019-02-14 LAB — RPR: RPR Ser Ql: NONREACTIVE

## 2019-02-14 LAB — HIV-1 RNA QUANT-NO REFLEX-BLD
HIV 1 RNA Quant: 28 copies/mL — ABNORMAL HIGH
HIV-1 RNA Quant, Log: 1.45 Log copies/mL — ABNORMAL HIGH

## 2019-02-21 ENCOUNTER — Telehealth: Payer: Self-pay

## 2019-02-21 NOTE — Telephone Encounter (Signed)
COVID-19 Pre-Screening Questions:02/21/19  Do you currently have a fever (>100 F), chills or unexplained body aches? NO   Are you currently experiencing new cough, shortness of breath, sore throat, runny nose? NO .  Have you recently travelled outside the state of [place] in the last 14 days? NO  .  Have you been in contact with someone that is currently pending confirmation of Covid19 testing or has been confirmed to have the Covid19 virus?  NO   **If the patient answers NO to ALL questions -  advise the patient to please call the clinic before coming to the office should any symptoms develop.     

## 2019-02-22 ENCOUNTER — Encounter: Payer: Self-pay | Admitting: Infectious Diseases

## 2019-02-22 ENCOUNTER — Other Ambulatory Visit: Payer: Self-pay | Admitting: *Deleted

## 2019-02-22 ENCOUNTER — Other Ambulatory Visit: Payer: Self-pay

## 2019-02-22 ENCOUNTER — Ambulatory Visit: Payer: BC Managed Care – PPO | Admitting: Infectious Diseases

## 2019-02-22 VITALS — BP 139/97 | HR 64 | Temp 98.1°F

## 2019-02-22 DIAGNOSIS — B20 Human immunodeficiency virus [HIV] disease: Secondary | ICD-10-CM

## 2019-02-22 DIAGNOSIS — F331 Major depressive disorder, recurrent, moderate: Secondary | ICD-10-CM | POA: Diagnosis not present

## 2019-02-22 DIAGNOSIS — M791 Myalgia, unspecified site: Secondary | ICD-10-CM | POA: Diagnosis not present

## 2019-02-22 DIAGNOSIS — Z23 Encounter for immunization: Secondary | ICD-10-CM

## 2019-02-22 DIAGNOSIS — J4530 Mild persistent asthma, uncomplicated: Secondary | ICD-10-CM

## 2019-02-22 MED ORDER — SUMATRIPTAN SUCCINATE 50 MG PO TABS: 50.0000 mg | ORAL_TABLET | Freq: Once | ORAL | 3 refills | Status: AC

## 2019-02-22 MED ORDER — MELOXICAM 15 MG PO TABS: 15.0000 mg | ORAL_TABLET | Freq: Every day | ORAL | 3 refills | Status: DC

## 2019-02-22 MED ORDER — DULOXETINE HCL 40 MG PO CPEP: 40.0000 mg | ORAL_CAPSULE | Freq: Every day | ORAL | 3 refills | Status: DC

## 2019-02-22 MED ORDER — TRIUMEQ 600-50-300 MG PO TABS: 1.0000 | ORAL_TABLET | Freq: Every day | ORAL | 11 refills | Status: DC

## 2019-02-22 MED ORDER — DULOXETINE HCL 40 MG PO CPEP: 20.0000 mg | ORAL_CAPSULE | Freq: Every day | ORAL | 3 refills | Status: DC

## 2019-02-22 NOTE — Assessment & Plan Note (Addendum)
He has been taking prednisone daily for his breathing as this has worsened lately; he attributes this to mask use. He is also using his Symbicort as instructed and MDI rescue 3-4 times a week.  Advised to follow up with PCP for further assessment for regimen

## 2019-02-22 NOTE — Assessment & Plan Note (Addendum)
Persistent pain to left upper and lower extremity. Described as muscular > joint pain with tenderness to touch "all over legs". 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 It is possible that even with gaining control over his infection now he can have persistence of symptoms. Will also retrial meloxicam for alternative non-immunomodulating anti-inflammatory effect. Advised to keep hydrated. He has noticed some worsening of symptoms in the setting of eating certain foods - I encouraged him to remove processed oils/carbohydrates from his diet and reduce overall carbohydrate intake to 50-75g a day to see how this helps his symptoms.   Alternatively he has a full time job, part time job and working heavily in Therapist, sports work now. He does report that when he sleeps after taking a flexeril that he is much improved. ?how much of this is physical manifestations of fatigue/depression/anxiety. Increasing cymbalta I am hopeful will continue to help.   Work up with Rheumatology was unrevealing for any inflammatory condition.  Work up with Neurology was unrevealing although they recommended EMG and brain MRI - I encouraged him to reschedule a follow up with them to continue to assist with diagnosis, especially relating to his left hand weakness.

## 2019-02-22 NOTE — Patient Instructions (Addendum)
For your pain:  1. I want you to stop the prednisone to see if we can get you off of it.  2. Increase your Cymbalta to 40 mg a day (2 pills of your current prescription, 1 of the new one) 3. Mobic once a day with food to see if this helps   For your diet I want you to consider approaching your food to reduce your carbohydrate.  -Please look into the Ketogenic or Paleo Diet.  -www.dietdoctor.com has excellent resources online including videos that you can watch for free.  -I am hopeful that this will reduce some of your pain and possible inflammatory triggers -I want you to stay away from margarine, canola oil, vegetable oil, seed oils. Good things to cook in that cause less inflammation include olive oil, avacado oil, coconut oil and regular butter.  -Try to limit your carbohydrates to vegetables only -Would limit pasta, bread, flour containing foods to 2 times a week with a goal to eventually limit them -Would prioritize protein at meals (chicken, [place], fish, eggs)   Please give your primary care team a call to discuss about your breathing and consider adding singulair. I would certainly start taking a zyrtec a day to see if this helps.    Please continue taking your Triumeq once a day as you are - you have plenty of refills.   Please send your FMLA to me so I can complete for you.   Would like to see you back in 4 months to check in again. No labs before.

## 2019-02-22 NOTE — Progress Notes (Signed)
Name: Mark Washington  DOB: 05/29/1986 MRN: [id number] PCP: Patient, No Pcp Per   Patient Active Problem List   Diagnosis Date Noted  . Muscle pain 04/28/2018  . Depression 04/28/2018  . HIV disease ([place]) 01/25/2018  . Low vitamin D level 01/25/2018  . Hypertension 01/25/2018  . Migraines 01/25/2018  . Health care maintenance 01/25/2018  . Asthma 01/25/2018     Brief Narrative:  Mark Washington is a 33 y.o. male with HIV Dx 09-2013 with CD4 nadir 107. He was previously in care with [facility] with Dr. Tyrell Antonio [phone], F# [phone]) for his HIV HIV Risk: MSM.  History of OIs: oral/esophageal candidiasis. Hep B/A immune. TB test negative (2019). [id number] neg. Marland Kitchen  Previous Regimens: . Stribild 2015 --> stopped d/t GI intolerability . Triumeq 01-2014  Genotypes: . 09-2013: K103N, R-efavirenz and neviripaine per records   Subjective:  CC:  Mark Washington is here for follow up on his HIV care. Ongoing lower leg pain/weakness. Requesting FMLA to be re-filed.   HPI:  Continues on Triumeq daily and reports he did miss a few doses recently due to pharmacy not filling. He was experiencing some nausea, however reports since he has begun taking the pill with water this has resolved.   He feels that his depression has been under much better control with addition of Cymbalta, but reports he still has some days where he feels down, usually when he is in pain. Continues to have "debilitating" muscle pain to the left arm and leg. Everyday over 3 weeks the left thumb that affects his grip. He has intermittent sudden waves of pain that causes him to "slow down" at work.  Reports this is intermittent in nature, nothing seems to provoke it, it is "random" and when it does occur it lasts 2-3 days. He reports muscles are tender to touch when this happens. He also reports he has noticed when he eats pork products, the pain is worse.  In the past was prescribed  Mobic for the pain but never took it due to being told it was not safe to take with his Triumeq. He was seen by rheumatology in March, at that time inflammatory markers and other labs were completed without significant findings to explain cause of pain. He does report taking flexeril at night recently and noticed resolution of the pain the following morning, but feels this isn't sustainable due to the drowsiness experienced from the medication.  He is concerned about losing his job. Reports increase in inhaler use since "having to wear a mask all the time," and has started taking prednisone that he had from the past, which he reports helps. Continues to use Symbicort.   He has trouble sleeping. Felt due to pain.   Review of Systems  All other systems reviewed and are negative.   Past Medical History:  Diagnosis Date  . Anxiety   . Asthma   . HIV (human immunodeficiency virus infection) ([place]) 01/25/2018  . Hypertension     Outpatient Medications Prior to Visit  Medication Sig Dispense Refill  . albuterol (VENTOLIN HFA) 108 (90 Base) MCG/ACT inhaler INHALE 2 PUFFS EVERY FOUR (4) HOURS AS NEEDED FOR WHEEZING.    Marland Kitchen lisinopril (ZESTRIL) 20 MG tablet Take by mouth.    . predniSONE (DELTASONE) 10 MG tablet 6 tablets po QD x 2d, 4 tablets po QDx2d, 2 tablets po QDx 2 d and off    . SYMBICORT 160-4.5 MCG/ACT inhaler     .  DULoxetine (CYMBALTA) 20 MG capsule Take 1 capsule (20 mg total) by mouth daily. 30 capsule 3  . lisinopril (PRINIVIL,ZESTRIL) 10 MG tablet Take 20 mg by mouth daily.    . SUMAtriptan (IMITREX) 50 MG tablet Take by mouth.    . TRIUMEQ 600-50-300 MG tablet TAKE ONE TABLET BY MOUTH ONCE DAILY. STORE IN ORIGINAL BOTTLE AT ROOM TEMPERATURE. 30 tablet 0  . methocarbamol (ROBAXIN-750) 750 MG tablet Take 2 tablets (1,500 mg total) by mouth 4 (four) times daily. (Patient not taking: Reported on 02/22/2019) 40 tablet 0  . meloxicam (MOBIC) 15 MG tablet Take 15 mg by mouth daily.    .  predniSONE (DELTASONE) 10 MG tablet Take 1 tablet (10 mg total) by mouth daily with breakfast. (Patient not taking: Reported on 02/22/2019) 24 tablet 0  . SUMAtriptan (IMITREX) 50 MG tablet Take 1 tablet (50 mg total) by mouth every 2 (two) hours as needed for migraine. May repeat in 2 hours if headache persists or recurs. (Patient not taking: Reported on 02/22/2019) 10 tablet 0  . traZODone (DESYREL) 50 MG tablet TAKE 1-2 TABLETS BY MOUTH AT BEDTIME (Patient not taking: Reported on 02/22/2019) 45 tablet 0   No facility-administered medications prior to visit.      Allergies  Allergen Reactions  . Iodine Anaphylaxis  . Shellfish Allergy Anaphylaxis  . Oxycodone-Acetaminophen Itching and Rash    Social History   Tobacco Use  . Smoking status: Never Smoker  . Smokeless tobacco: Never Used  Substance Use Topics  . Alcohol use: No    Frequency: Never  . Drug use: No    Social History   Substance and Sexual Activity  Sexual Activity Not Currently  . Birth control/protection: None   Comment: declined condoms    Objective:   Vitals:   02/22/19 0856 02/22/19 0942  BP: (!) 147/104 (!) 139/97  Pulse: 64   Temp: 98.1 F (36.7 C)   TempSrc: Oral   SpO2: 99%    There is no height or weight on file to calculate BMI.  Physical Exam Constitutional:      Appearance: Normal appearance.  HENT:     Head: Normocephalic.     Nose: Nose normal.     Mouth/Throat:     Mouth: Mucous membranes are moist.     Pharynx: Oropharynx is clear.  Eyes:     Pupils: Pupils are equal, round, and reactive to light.  Neck:     Musculoskeletal: Normal range of motion.  Cardiovascular:     Rate and Rhythm: Normal rate and regular rhythm.  Pulmonary:     Effort: Pulmonary effort is normal.  Abdominal:     General: Abdomen is flat.     Palpations: Abdomen is soft.  Musculoskeletal: Normal range of motion.  Skin:    General: Skin is warm and dry.     Capillary Refill: Capillary refill takes less  than 2 seconds.  Neurological:     General: No focal deficit present.     Mental Status: He is alert and oriented to person, place, and time.  Psychiatric:        Mood and Affect: Mood normal.        Thought Content: Thought content normal.        Judgment: Judgment normal.     Lab Results Lab Results  Component Value Date   WBC 4.8 02/07/2019   HGB 15.1 02/07/2019   HCT 44.7 02/07/2019   MCV 88.0 02/07/2019   PLT 142  02/07/2019    Lab Results  Component Value Date   CREATININE 1.43 (H) 02/07/2019   BUN 14 02/07/2019   NA 139 02/07/2019   K 4.3 02/07/2019   CL 103 02/07/2019   CO2 28 02/07/2019    Lab Results  Component Value Date   ALT 14 02/07/2019   AST 15 02/07/2019   BILITOT 0.5 02/07/2019    Lab Results  Component Value Date   CHOL 181 01/11/2018   HDL 45 01/11/2018   LDLCALC 105 (H) 01/11/2018   TRIG 193 (H) 01/11/2018   CHOLHDL 4.0 01/11/2018   HIV 1 RNA Quant (copies/mL)  Date Value  02/07/2019 28 (H)  04/26/2018 61 (H)  01/11/2018 372,000 (H)   CD4 T Cell Abs (/uL)  Date Value  02/07/2019 338 (L)  04/26/2018 310 (L)  01/11/2018 330 (L)     Assessment & Plan:   Problem List Items Addressed This Visit      Unprioritized   HIV disease ([place])    Continues to take Triumeq, does report a couple of missed doses. Advised this was due to pharmacy and he hasn't missed anymore doses since having prescription filled. Viral load 28 and CD4 338. Will recheck viral load and CD4 in 4 months at next appointment.  Return in about 4 months (around 06/24/2019).        Relevant Medications   abacavir-dolutegravir-lamiVUDine (TRIUMEQ) 600-50-300 MG tablet   Other Relevant Orders   HPV 9-valent vaccine,Recombinat (Completed)   Asthma    He has been taking prednisone daily for his breathing as this has worsened lately; he attributes this to mask use. He is also using his Symbicort as instructed and MDI rescue 3-4 times a week.  Advised to follow up with PCP  for further assessment for regimen       Relevant Medications   albuterol (VENTOLIN HFA) 108 (90 Base) MCG/ACT inhaler   SYMBICORT 160-4.5 MCG/ACT inhaler   predniSONE (DELTASONE) 10 MG tablet   Muscle pain    Persistent pain to left upper and lower extremity. Described as muscular > joint pain with tenderness to touch "all over legs". 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 It is possible that even with gaining control over his infection now he can have persistence of symptoms. Will also retrial meloxicam for alternative non-immunomodulating anti-inflammatory effect. Advised to keep hydrated. He has noticed some worsening of symptoms in the setting of eating certain foods - I encouraged him to remove processed oils/carbohydrates from his diet and reduce overall carbohydrate intake to 50-75g a day to see how this helps his symptoms.   Alternatively he has a full time job, part time job and working heavily in Therapist, sports work now. He does report that when he sleeps after taking a flexeril that he is much improved. ?how much of this is physical manifestations of fatigue/depression/anxiety. Increasing cymbalta I am hopeful will continue to help.   Work up with Rheumatology was unrevealing for any inflammatory condition.  Work up with Neurology was unrevealing although they recommended EMG and brain MRI - I encouraged him to reschedule a follow up with them to continue to assist with diagnosis, especially relating to his left hand weakness.       Depression    Improving control on Cymbalta. Reports some "down" days  due to his pain, but not something he feels is a concern. Has a great relationship with his mother who is his biggest supporter.  Increase Cymbalta 40 mg QD. He will consider counseling services here in the clinic or at off-site.         Other Visit Diagnoses    Need for HPV vaccination    -  Primary   Relevant Orders   HPV 9-valent vaccine,Recombinat (Completed)     I spent 25 minutes with the patient in discussion and counseling of the above.   Janene Madeira, MSN, NP-C [facility] [place] Pager: [phone] Office: [phone]  02/22/19  10:05 PM

## 2019-02-22 NOTE — Assessment & Plan Note (Signed)
Continues to take Triumeq, does report a couple of missed doses. Advised this was due to pharmacy and he hasn't missed anymore doses since having prescription filled. Viral load 28 and CD4 338. Will recheck viral load and CD4 in 4 months at next appointment.  Return in about 4 months (around 06/24/2019).

## 2019-02-22 NOTE — Assessment & Plan Note (Addendum)
Improving control on Cymbalta. Reports some "down" days due to his pain, but not something he feels is a concern. Has a great relationship with his mother who is his biggest supporter.  Increase Cymbalta 40 mg QD. He will consider counseling services here in the clinic or at off-site.

## 2019-03-06 ENCOUNTER — Telehealth: Payer: Self-pay

## 2019-03-06 NOTE — Telephone Encounter (Signed)
Spoke with patient regarding FMLA paperwork. Patient has a copy and will fax to the office himself. Provided patient [phone] fax number. Patient states he needed this paperwork by 03/12/19. Advised patient once [facility] recieves it will take up to 14 days for the provider to review/sign and return. Patient voiced. Understanding.   Eugenia Mcalpine

## 2019-03-07 NOTE — Telephone Encounter (Signed)
Received [place] FMLA paperwork via fax. Place in [place], NP box. Also placed copy in triage.  Eugenia Mcalpine

## 2019-03-07 NOTE — Telephone Encounter (Signed)
Excellent - I can try to come tomorrow to get it filled out for him. Thank you kindly

## 2019-03-14 NOTE — Telephone Encounter (Signed)
Received completed FMLA paper work by provider. Forms faxed to [facility] [phone]. Confirmation received, forms to be scanned into Epic. Eugenia Mcalpine

## 2019-03-21 ENCOUNTER — Other Ambulatory Visit: Payer: Self-pay | Admitting: Infectious Diseases

## 2019-03-21 DIAGNOSIS — F331 Major depressive disorder, recurrent, moderate: Secondary | ICD-10-CM

## 2019-06-14 ENCOUNTER — Ambulatory Visit: Payer: BC Managed Care – PPO | Admitting: Infectious Diseases

## 2019-09-04 ENCOUNTER — Other Ambulatory Visit: Payer: Self-pay

## 2019-09-04 DIAGNOSIS — Z113 Encounter for screening for infections with a predominantly sexual mode of transmission: Secondary | ICD-10-CM

## 2019-09-04 DIAGNOSIS — Z79899 Other long term (current) drug therapy: Secondary | ICD-10-CM

## 2019-09-04 DIAGNOSIS — B20 Human immunodeficiency virus [HIV] disease: Secondary | ICD-10-CM

## 2019-09-06 ENCOUNTER — Other Ambulatory Visit: Payer: BC Managed Care – PPO

## 2019-09-20 ENCOUNTER — Ambulatory Visit: Payer: BC Managed Care – PPO | Admitting: Infectious Diseases

## 2019-09-25 ENCOUNTER — Other Ambulatory Visit: Payer: BC Managed Care – PPO

## 2019-10-09 ENCOUNTER — Telehealth: Payer: Self-pay | Admitting: *Deleted

## 2019-10-09 ENCOUNTER — Ambulatory Visit: Payer: BC Managed Care – PPO | Admitting: Infectious Diseases

## 2019-10-09 NOTE — Telephone Encounter (Signed)
RN left message asking patient to reschedule today's missed visit. Patient has missed 3 appointments in a row with provider, will need to be scheduled with pharmacy for next visit. Andree Coss, RN

## 2020-07-19 IMAGING — MR MR CERVICAL SPINE W/O CM
4 of 5 series · 19 of 48 positions shown · non-contrast
Comparison: None.

CLINICAL DATA: Weakness and tingling in all extremities. Extremity
pain. History of HIV.

EXAM:
MRI CERVICAL SPINE WITHOUT CONTRAST
TECHNIQUE: Multiplanar, multisequence MR imaging of the cervical spine was
performed. No intravenous contrast was administered.

[Series 3: T2 · sagittal · 3.0mm · 0.43mm/px · 7 of 15 slices shown (1 of 2)]
[im 1/15]
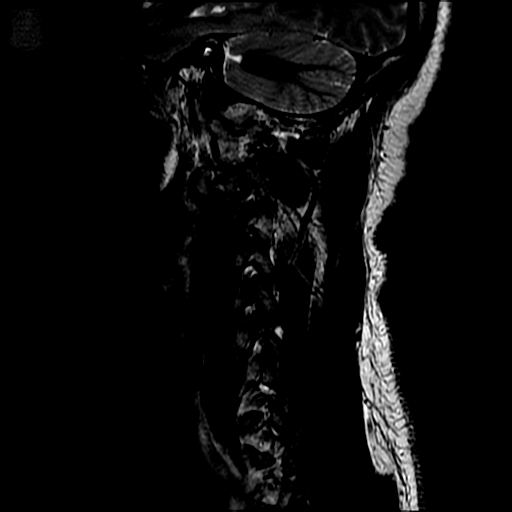
[im 3/15]
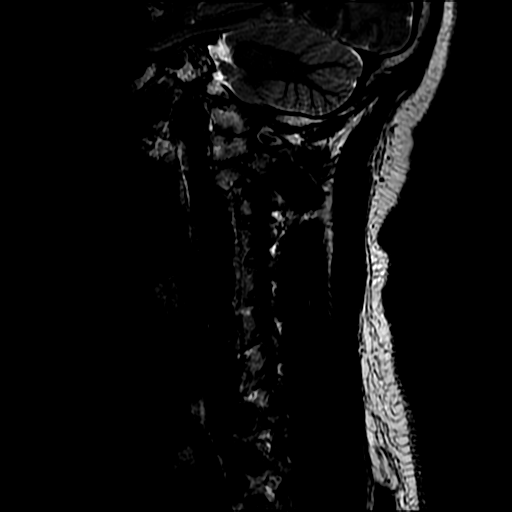
[im 5/15]
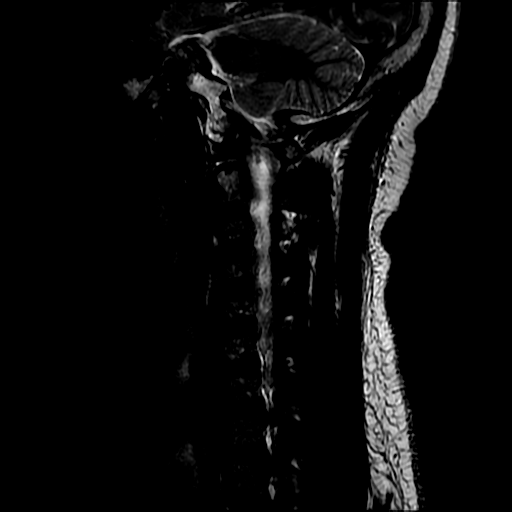
[im 8/15]
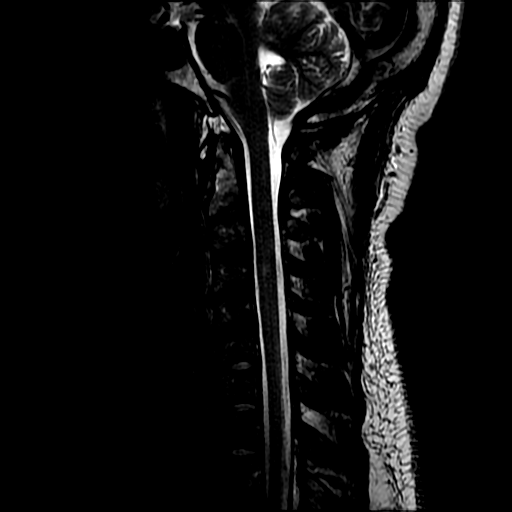
[im 10/15]
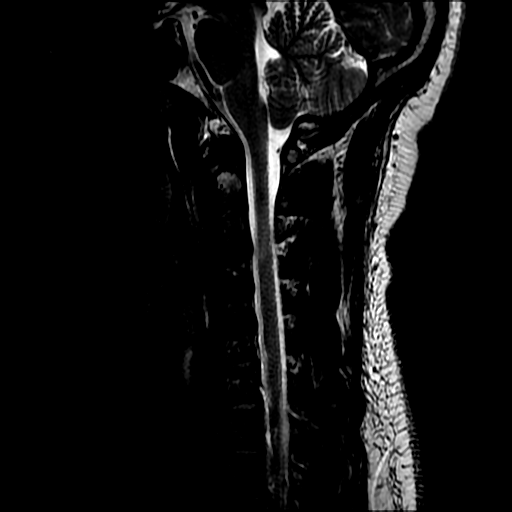
[im 12/15]
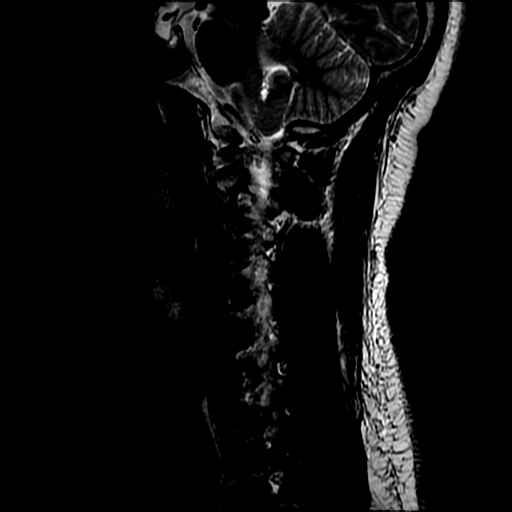
[im 15/15]
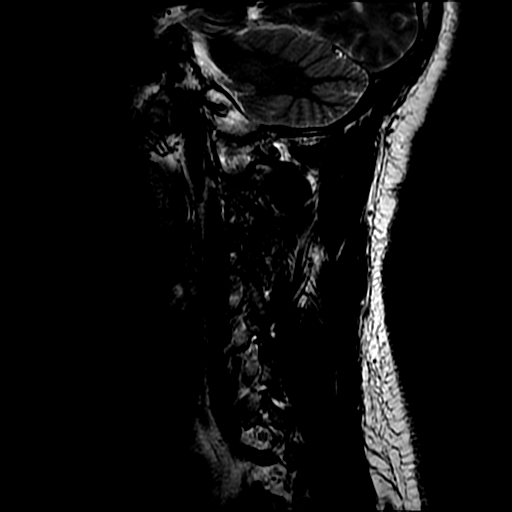

[Series 4: FLAIR · sagittal · 3.0mm · 0.43mm/px · 3 of 15 slices shown]
[im 3/15]
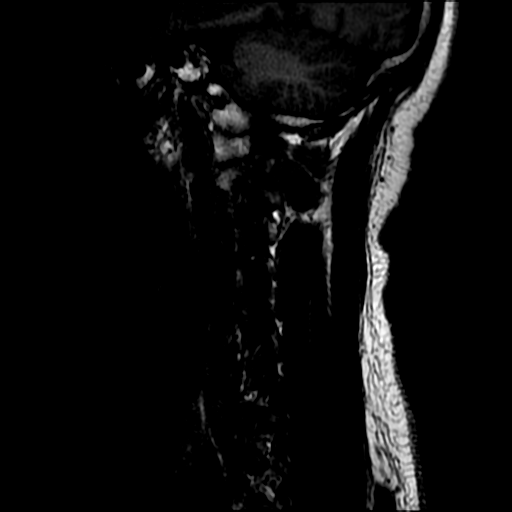
[im 9/15]
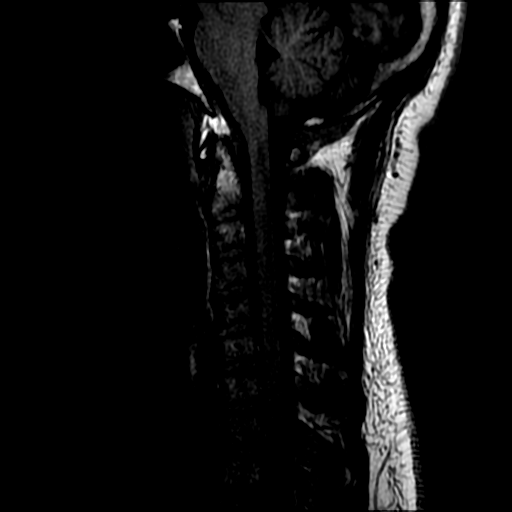
[im 15/15]
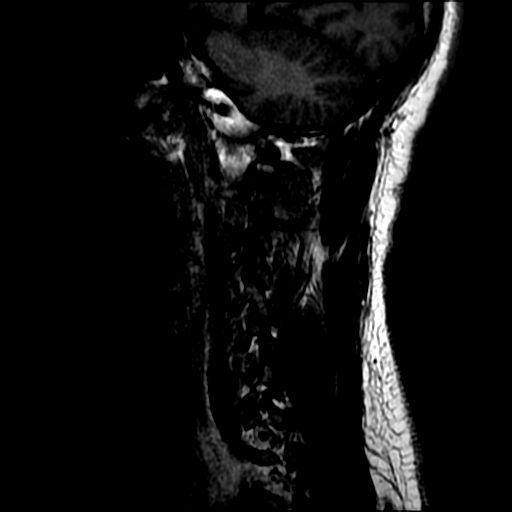

[Series 5: T2 · axial · 3.0mm · 0.35mm/px · z∈[-84,-4]mm · 6 of 31 slices shown (2 of 2)]
[im 1/31]
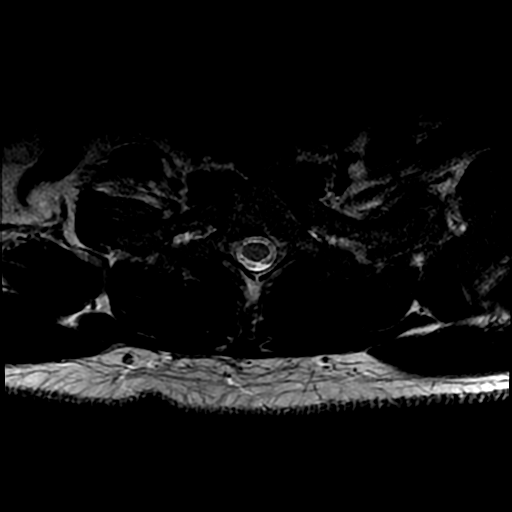
[im 6/31]
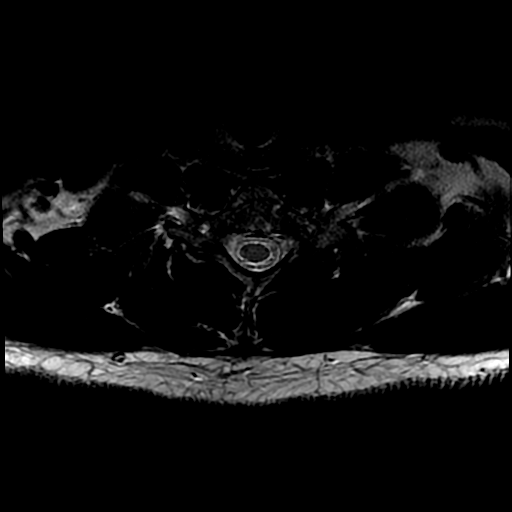
[im 11/31]
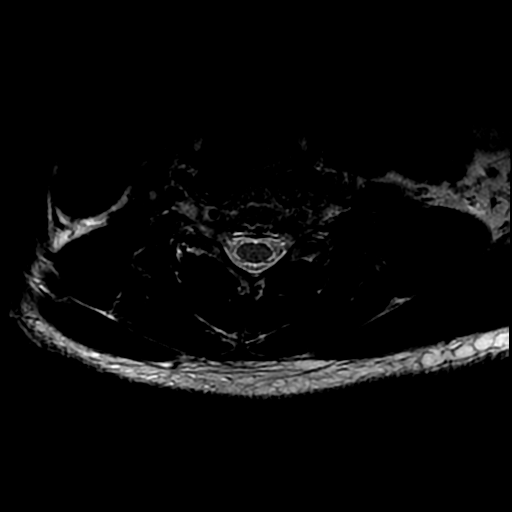
[im 13/31]
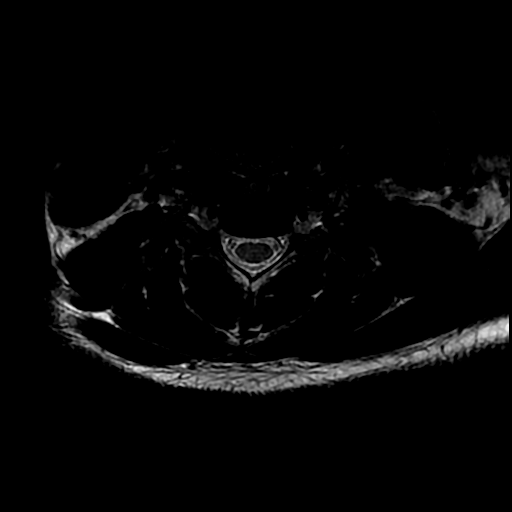
[im 16/31]
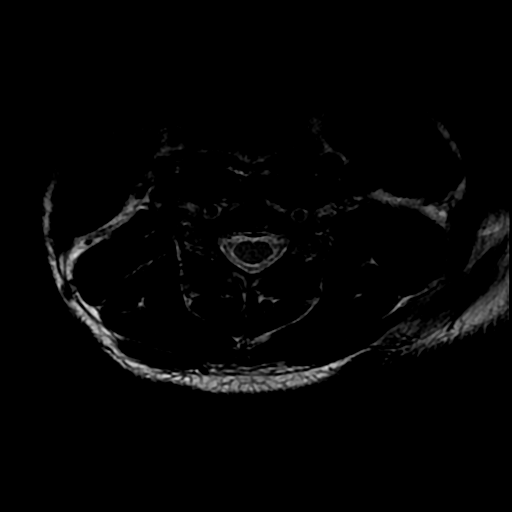
[im 26/31]
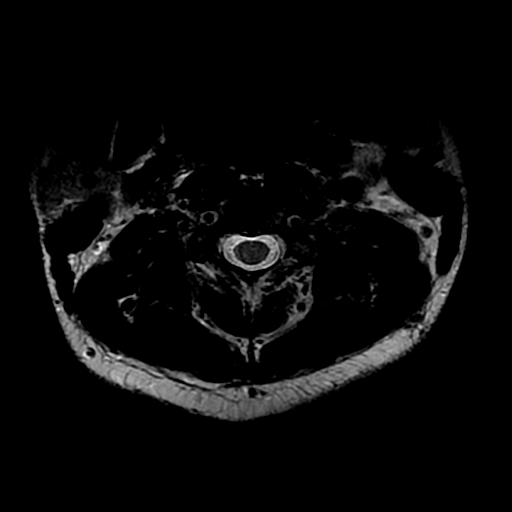

[Series 6: STIR · sagittal · 3.0mm · 0.43mm/px · 3 of 15 slices shown]
[im 3/15]
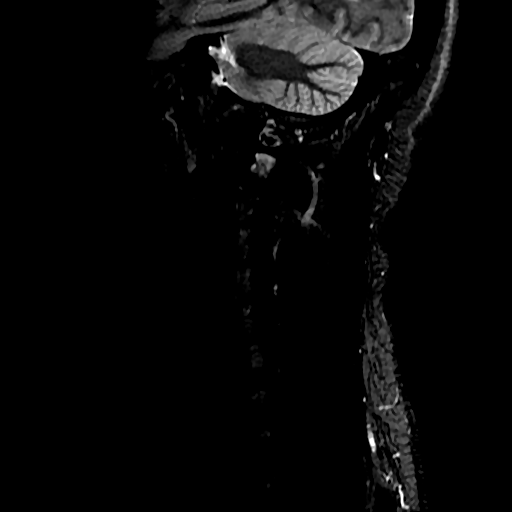
[im 9/15]
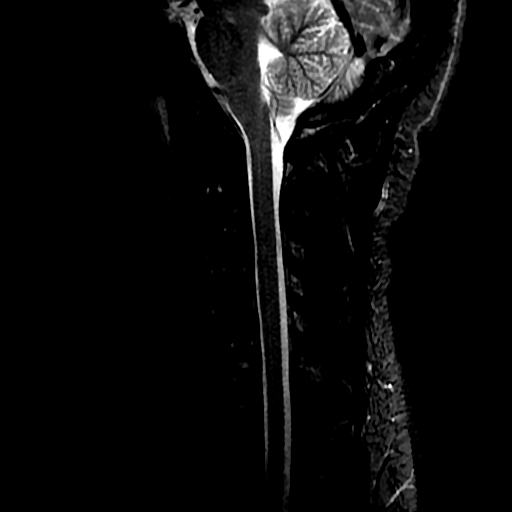
[im 15/15]
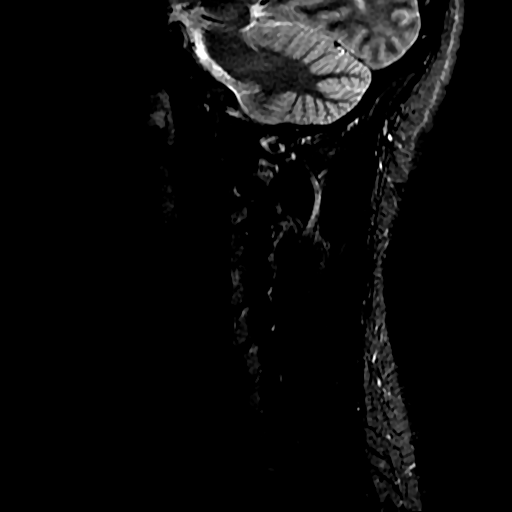

[19 of 48 positions shown; findings below may reference images not displayed]

FINDINGS: Alignment: Normal.

Vertebrae: No focal marrow lesion. No compression fracture or
evidence of discitis osteomyelitis.

Cord: Normal caliber and signal.

Posterior Fossa, vertebral arteries, paraspinal tissues: Visualized
posterior fossa is normal. Vertebral artery flow voids are
preserved. No prevertebral effusion.

Disc levels: Sagittal imaging includes the atlantoaxial joint to the
level of the T1-2 disc space, with axial imaging of the disc spaces
from C2-3 to C7-T1.

No disc herniation, spinal canal stenosis or neural impingement.
IMPRESSION: Normal cervical spine MRI.

## 2022-07-28 ENCOUNTER — Other Ambulatory Visit: Payer: Self-pay

## 2022-07-28 ENCOUNTER — Encounter (HOSPITAL_COMMUNITY): Payer: Self-pay | Admitting: Emergency Medicine

## 2022-07-28 ENCOUNTER — Emergency Department (HOSPITAL_COMMUNITY): Payer: Medicaid Other

## 2022-07-28 ENCOUNTER — Observation Stay (HOSPITAL_COMMUNITY)
Admission: EM | Admit: 2022-07-28 | Discharge: 2022-07-31 | Disposition: A | Discharge status: Home / Self Care | Payer: Medicaid Other | Attending: Infectious Diseases | Admitting: Infectious Diseases

## 2022-07-28 DIAGNOSIS — I160 Hypertensive urgency: Secondary | ICD-10-CM | POA: Diagnosis not present

## 2022-07-28 DIAGNOSIS — R42 Dizziness and giddiness: Secondary | ICD-10-CM | POA: Diagnosis not present

## 2022-07-28 DIAGNOSIS — Z7901 Long term (current) use of anticoagulants: Secondary | ICD-10-CM | POA: Insufficient documentation

## 2022-07-28 DIAGNOSIS — Z79899 Other long term (current) drug therapy: Secondary | ICD-10-CM | POA: Insufficient documentation

## 2022-07-28 DIAGNOSIS — G4700 Insomnia, unspecified: Secondary | ICD-10-CM | POA: Insufficient documentation

## 2022-07-28 DIAGNOSIS — Z1152 Encounter for screening for COVID-19: Secondary | ICD-10-CM | POA: Insufficient documentation

## 2022-07-28 DIAGNOSIS — R1310 Dysphagia, unspecified: Secondary | ICD-10-CM | POA: Diagnosis not present

## 2022-07-28 DIAGNOSIS — R110 Nausea: Secondary | ICD-10-CM

## 2022-07-28 DIAGNOSIS — Z8616 Personal history of COVID-19: Secondary | ICD-10-CM | POA: Insufficient documentation

## 2022-07-28 DIAGNOSIS — R0602 Shortness of breath: Secondary | ICD-10-CM | POA: Diagnosis present

## 2022-07-28 DIAGNOSIS — I10 Essential (primary) hypertension: Secondary | ICD-10-CM | POA: Diagnosis not present

## 2022-07-28 DIAGNOSIS — Z21 Asymptomatic human immunodeficiency virus [HIV] infection status: Secondary | ICD-10-CM | POA: Diagnosis not present

## 2022-07-28 DIAGNOSIS — H8110 Benign paroxysmal vertigo, unspecified ear: Secondary | ICD-10-CM

## 2022-07-28 DIAGNOSIS — J45909 Unspecified asthma, uncomplicated: Secondary | ICD-10-CM | POA: Insufficient documentation

## 2022-07-28 DIAGNOSIS — F310 Bipolar disorder, current episode hypomanic: Secondary | ICD-10-CM | POA: Insufficient documentation

## 2022-07-28 DIAGNOSIS — R630 Anorexia: Secondary | ICD-10-CM | POA: Diagnosis not present

## 2022-07-28 DIAGNOSIS — F411 Generalized anxiety disorder: Secondary | ICD-10-CM | POA: Diagnosis present

## 2022-07-28 DIAGNOSIS — B20 Human immunodeficiency virus [HIV] disease: Secondary | ICD-10-CM | POA: Insufficient documentation

## 2022-07-28 DIAGNOSIS — F1220 Cannabis dependence, uncomplicated: Secondary | ICD-10-CM | POA: Insufficient documentation

## 2022-07-28 HISTORY — DX: Systemic lupus erythematosus, unspecified: M32.9

## 2022-07-28 LAB — CBC WITH DIFFERENTIAL/PLATELET
Abs Immature Granulocytes: 0.02 10*3/uL (ref 0.00–0.07)
Basophils Absolute: 0 10*3/uL (ref 0.0–0.1)
Basophils Relative: 0 %
Eosinophils Absolute: 0.1 10*3/uL (ref 0.0–0.5)
Eosinophils Relative: 1 %
HCT: 49.1 % (ref 39.0–52.0)
Hemoglobin: 16.8 g/dL (ref 13.0–17.0)
Immature Granulocytes: 0 %
Lymphocytes Relative: 57 %
Lymphs Abs: 4 10*3/uL (ref 0.7–4.0)
MCH: 29.9 pg (ref 26.0–34.0)
MCHC: 34.2 g/dL (ref 30.0–36.0)
MCV: 87.5 fL (ref 80.0–100.0)
Monocytes Absolute: 0.8 10*3/uL (ref 0.1–1.0)
Monocytes Relative: 11 %
Neutro Abs: 2.2 10*3/uL (ref 1.7–7.7)
Neutrophils Relative %: 31 %
Platelets: 178 10*3/uL (ref 150–400)
RBC: 5.61 MIL/uL (ref 4.22–5.81)
RDW: 12.8 % (ref 11.5–15.5)
WBC: 7.1 10*3/uL (ref 4.0–10.5)
nRBC: 0 % (ref 0.0–0.2)

## 2022-07-28 NOTE — ED Provider Triage Note (Signed)
Emergency Medicine Provider Triage Evaluation Note  Mark Washington , a 37 y.o. male  was evaluated in triage.  Pt complains of 3 days of feeling weak all over, upper abdominal pain, feeling short of breath.    Review of Systems  Positive:  Negative:   Physical Exam  There were no vitals taken for this visit. Gen:   Awake, no distress   Resp:  Normal effort  MSK:   Moves extremities without difficulty  Other:  Epigastric tenderness without rigidity or guarding.  Lungs are clear  Medical Decision Making  Medically screening exam initiated at 11:21 PM.  Appropriate orders placed.  Mark Washington was informed that the remainder of the evaluation will be completed by another provider, this initial triage assessment does not replace that evaluation, and the importance of remaining in the ED until their evaluation is complete.     Mark Raring, PA-C 07/28/22 2321

## 2022-07-28 NOTE — ED Triage Notes (Signed)
Pt c/o feeling like his food is getting stuck and shob. Pt states that he hasn't ate in 3 days.

## 2022-07-29 DIAGNOSIS — I160 Hypertensive urgency: Secondary | ICD-10-CM | POA: Diagnosis present

## 2022-07-29 LAB — COMPREHENSIVE METABOLIC PANEL
ALT: 19 U/L (ref 0–44)
AST: 22 U/L (ref 15–41)
Albumin: 4.6 g/dL (ref 3.5–5.0)
Alkaline Phosphatase: 66 U/L (ref 38–126)
Anion gap: 10 (ref 5–15)
BUN: 7 mg/dL (ref 6–20)
CO2: 25 mmol/L (ref 22–32)
Calcium: 9.5 mg/dL (ref 8.9–10.3)
Chloride: 100 mmol/L (ref 98–111)
Creatinine, Ser: 1.38 mg/dL — ABNORMAL HIGH (ref 0.61–1.24)
GFR, Estimated: >60 mL/min (ref >60)
Glucose, Bld: 104 mg/dL — ABNORMAL HIGH (ref 70–99)
Potassium: 3.5 mmol/L (ref 3.5–5.1)
Sodium: 135 mmol/L (ref 135–145)
Total Bilirubin: 0.9 mg/dL (ref 0.3–1.2)
Total Protein: 8 g/dL (ref 6.5–8.1)

## 2022-07-29 LAB — RAPID URINE DRUG SCREEN, HOSP PERFORMED
Amphetamines: NOT DETECTED
Barbiturates: NOT DETECTED
Benzodiazepines: NOT DETECTED
Cocaine: NOT DETECTED
Opiates: NOT DETECTED
Tetrahydrocannabinol: POSITIVE — AB

## 2022-07-29 LAB — URINALYSIS, ROUTINE W REFLEX MICROSCOPIC
Bilirubin Urine: NEGATIVE
Glucose, UA: NEGATIVE mg/dL
Hgb urine dipstick: NEGATIVE
Ketones, ur: NEGATIVE mg/dL
Leukocytes,Ua: NEGATIVE
Nitrite: NEGATIVE
Protein, ur: NEGATIVE mg/dL
Specific Gravity, Urine: 1.005 (ref 1.005–1.030)
pH: 7 (ref 5.0–8.0)

## 2022-07-29 LAB — RESP PANEL BY RT-PCR (RSV, FLU A&B, COVID)  RVPGX2
Influenza A by PCR: NEGATIVE
Influenza B by PCR: NEGATIVE
Resp Syncytial Virus by PCR: NEGATIVE
SARS Coronavirus 2 by RT PCR: NEGATIVE

## 2022-07-29 LAB — D-DIMER, QUANTITATIVE: D-Dimer, Quant: <0.27 ug/mL-FEU (ref 0.00–0.50)

## 2022-07-29 LAB — TSH: TSH: 1.093 u[IU]/mL (ref 0.350–4.500)

## 2022-07-29 LAB — CBG MONITORING, ED: Glucose-Capillary: 103 mg/dL — ABNORMAL HIGH (ref 70–99)

## 2022-07-29 LAB — LIPASE, BLOOD: Lipase: 30 U/L (ref 11–51)

## 2022-07-29 LAB — TROPONIN I (HIGH SENSITIVITY)
Troponin I (High Sensitivity): 4 ng/L (ref <18)
Troponin I (High Sensitivity): 4 ng/L (ref <18)

## 2022-07-29 MED ORDER — SENNOSIDES-DOCUSATE SODIUM 8.6-50 MG PO TABS: 1.0000 | ORAL_TABLET | Freq: Every evening | ORAL | Status: DC | PRN

## 2022-07-29 MED ORDER — ALUM & MAG HYDROXIDE-SIMETH 200-200-20 MG/5ML PO SUSP
30.0000 mL | Freq: Once | ORAL | Status: AC
  Administered 2022-07-29: 30 mL via ORAL
  Filled 2022-07-29: qty 30

## 2022-07-29 MED ORDER — SODIUM CHLORIDE 0.9 % IV BOLUS
1000.0000 mL | Freq: Once | INTRAVENOUS | Status: AC
  Administered 2022-07-29: 1000 mL via INTRAVENOUS

## 2022-07-29 MED ORDER — RIVAROXABAN 10 MG PO TABS
10.0000 mg | ORAL_TABLET | Freq: Every day | ORAL | Status: DC
  Administered 2022-07-29 – 2022-07-31 (×3): 10 mg via ORAL
  Filled 2022-07-29 (×3): qty 1

## 2022-07-29 MED ORDER — TRAZODONE HCL 50 MG PO TABS
50.0000 mg | ORAL_TABLET | Freq: Every day | ORAL | Status: DC
  Administered 2022-07-29: 50 mg via ORAL
  Filled 2022-07-29: qty 1

## 2022-07-29 MED ORDER — HYDROCHLOROTHIAZIDE 12.5 MG PO TABS
12.5000 mg | ORAL_TABLET | Freq: Once | ORAL | Status: AC
  Administered 2022-07-29: 12.5 mg via ORAL
  Filled 2022-07-29: qty 1

## 2022-07-29 MED ORDER — LACTATED RINGERS IV SOLN: INTRAVENOUS | Status: DC

## 2022-07-29 MED ORDER — FAMOTIDINE 20 MG PO TABS
20.0000 mg | ORAL_TABLET | Freq: Once | ORAL | Status: AC
  Administered 2022-07-29: 20 mg via ORAL
  Filled 2022-07-29: qty 1

## 2022-07-29 MED ORDER — ONDANSETRON HCL 4 MG/2ML IJ SOLN
4.0000 mg | Freq: Once | INTRAMUSCULAR | Status: AC
  Administered 2022-07-29: 4 mg via INTRAVENOUS
  Filled 2022-07-29: qty 2

## 2022-07-29 MED ORDER — HYDRALAZINE HCL 20 MG/ML IJ SOLN
10.0000 mg | Freq: Once | INTRAMUSCULAR | Status: AC
  Administered 2022-07-29: 10 mg via INTRAVENOUS
  Filled 2022-07-29: qty 1

## 2022-07-29 MED ORDER — AMLODIPINE BESYLATE 5 MG PO TABS
5.0000 mg | ORAL_TABLET | Freq: Once | ORAL | Status: AC
  Administered 2022-07-29: 5 mg via ORAL
  Filled 2022-07-29: qty 1

## 2022-07-29 MED ORDER — LABETALOL HCL 5 MG/ML IV SOLN
20.0000 mg | Freq: Once | INTRAVENOUS | Status: AC
  Administered 2022-07-29: 20 mg via INTRAVENOUS
  Filled 2022-07-29: qty 4

## 2022-07-29 MED ORDER — ACETAMINOPHEN 500 MG PO TABS
1000.0000 mg | ORAL_TABLET | Freq: Once | ORAL | Status: AC
  Administered 2022-07-29: 1000 mg via ORAL
  Filled 2022-07-29: qty 2

## 2022-07-29 MED ORDER — AMLODIPINE BESYLATE 10 MG PO TABS
10.0000 mg | ORAL_TABLET | Freq: Every day | ORAL | Status: DC
  Administered 2022-07-30 – 2022-07-31 (×2): 10 mg via ORAL
  Filled 2022-07-29 (×2): qty 1

## 2022-07-29 MED ORDER — AMLODIPINE BESYLATE 5 MG PO TABS: 5.0000 mg | ORAL_TABLET | Freq: Every day | ORAL | 0 refills | Status: DC

## 2022-07-29 MED ORDER — HYDROCHLOROTHIAZIDE 25 MG PO TABS
25.0000 mg | ORAL_TABLET | Freq: Every day | ORAL | Status: DC
  Administered 2022-07-30 – 2022-07-31 (×2): 25 mg via ORAL
  Filled 2022-07-29 (×2): qty 1

## 2022-07-29 NOTE — ED Provider Notes (Addendum)
[place] EMERGENCY DEPARTMENT AT [facility] Provider Note   CSN: [id number] Arrival date & time: 07/28/22  2145     History  Chief Complaint  Patient presents with   Shortness of Breath    Mark Washington is a 37 y.o. male.  Pt with hx htn, c/o being concerned his blood pressure was high, and also indicates intermittent nausea when tries to eat. States after concerned about blood pressure felt mildly sob. Symptoms acute onset, moderate, persistent. Indicates hx htn, and currently takes hctz - has not yet had today. No headache. No change in speech or vision. No numbness/weakness. No chest pain.  With nausea indicates current issue for several months, increased in past 3-4 days, moderate, persistent. No abd distension or pain. Having normal bms. No fever or chills.   The history is provided by the patient and medical records.  Shortness of Breath Associated symptoms: no abdominal pain, no chest pain, no cough, no fever, no headaches, no neck pain, no rash, no sore throat and no vomiting        Home Medications Prior to Admission medications   Medication Sig Start Date End Date Taking? Authorizing Provider  abacavir-dolutegravir-lamiVUDine (TRIUMEQ) 600-50-300 MG tablet Take 1 tablet by mouth daily. 02/22/19   Blanchard Kelch, NP  albuterol (VENTOLIN HFA) 108 (90 Base) MCG/ACT inhaler INHALE 2 PUFFS EVERY FOUR (4) HOURS AS NEEDED FOR WHEEZING. 11/03/18   [provider]  DULoxetine HCl 40 MG CPEP TAKE 1 CAPSULE BY MOUTH EVERY DAY 03/21/19   Blanchard Kelch, NP  lisinopril (ZESTRIL) 20 MG tablet Take by mouth. 09/22/17   [provider]  meloxicam (MOBIC) 15 MG tablet Take 1 tablet (15 mg total) by mouth daily. 02/22/19   Blanchard Kelch, NP  methocarbamol (ROBAXIN-750) 750 MG tablet Take 2 tablets (1,500 mg total) by mouth 4 (four) times daily. Patient not taking: Reported on 02/22/2019 09/12/17   Joni Reining, PA-C  predniSONE (DELTASONE) 10 MG  tablet 6 tablets po QD x 2d, 4 tablets po QDx2d, 2 tablets po QDx 2 d and off 08/07/16   [provider]  SUMAtriptan (IMITREX) 50 MG tablet Take 1 tablet (50 mg total) by mouth once for 1 dose. 02/22/19 02/22/19  Blanchard Kelch, NP  SYMBICORT 160-4.5 MCG/ACT inhaler  01/30/19   [provider]      Allergies    Iodine, Shellfish allergy, and Oxycodone-acetaminophen    Review of Systems   Review of Systems  Constitutional:  Negative for fever.  HENT:  Negative for sore throat.   Eyes:  Negative for visual disturbance.  Respiratory:  Positive for shortness of breath. Negative for cough.   Cardiovascular:  Negative for chest pain and leg swelling.  Gastrointestinal:  Positive for nausea. Negative for abdominal pain and vomiting.  Genitourinary:  Negative for dysuria and flank pain.  Musculoskeletal:  Negative for back pain and neck pain.  Skin:  Negative for rash.  Neurological:  Negative for speech difficulty, weakness, numbness and headaches.  Hematological:  Does not bruise/bleed easily.  Psychiatric/Behavioral:  Negative for confusion.     Physical Exam Updated Vital Signs BP (!) 158/101   Pulse 80   Temp 98.6 F (37 C) (Oral)   Resp 20   Ht 1.626 m (5\' 4" )   Wt 89 kg   SpO2 94%   BMI 33.68 kg/m  Physical Exam Vitals and nursing note reviewed.  Constitutional:      Appearance: Normal appearance.  He is well-developed.  HENT:     Head: Atraumatic.     Nose: Nose normal.     Mouth/Throat:     Mouth: Mucous membranes are moist.     Pharynx: Oropharynx is clear.  Eyes:     General: No scleral icterus.    Conjunctiva/sclera: Conjunctivae normal.     Pupils: Pupils are equal, round, and reactive to light.  Neck:     Trachea: No tracheal deviation.  Cardiovascular:     Rate and Rhythm: Normal rate and regular rhythm.     Pulses: Normal pulses.     Heart sounds: Normal heart sounds. No murmur heard.    No friction rub. No gallop.  Pulmonary:      Effort: Pulmonary effort is normal. No accessory muscle usage or respiratory distress.     Breath sounds: Normal breath sounds.  Abdominal:     General: There is no distension.     Palpations: Abdomen is soft.     Tenderness: There is no abdominal tenderness.  Musculoskeletal:        General: No swelling or tenderness.     Cervical back: Normal range of motion and neck supple. No rigidity.     Right lower leg: No edema.     Left lower leg: No edema.  Skin:    General: Skin is warm and dry.     Findings: No rash.  Neurological:     Mental Status: He is alert.     Comments: Alert, speech clear. Motor/sens grossly intact bil. Steady gait.   Psychiatric:        Mood and Affect: Mood normal.     ED Results / Procedures / Treatments   Labs (all labs ordered are listed, but only abnormal results are displayed) Results for orders placed or performed during the hospital encounter of 07/28/22  Resp panel by RT-PCR (RSV, Flu A&B, Covid) Anterior Nasal Swab   Specimen: Anterior Nasal Swab  Result Value Ref Range   SARS Coronavirus 2 by RT PCR NEGATIVE NEGATIVE   Influenza A by PCR NEGATIVE NEGATIVE   Influenza B by PCR NEGATIVE NEGATIVE   Resp Syncytial Virus by PCR NEGATIVE NEGATIVE  CBC with Differential  Result Value Ref Range   WBC 7.1 4.0 - 10.5 K/uL   RBC 5.61 4.22 - 5.81 MIL/uL   Hemoglobin 16.8 13.0 - 17.0 g/dL   HCT 49.1 39.0 - 52.0 %   MCV 87.5 80.0 - 100.0 fL   MCH 29.9 26.0 - 34.0 pg   MCHC 34.2 30.0 - 36.0 g/dL   RDW 12.8 11.5 - 15.5 %   Platelets 178 150 - 400 K/uL   nRBC 0.0 0.0 - 0.2 %   Neutrophils Relative % 31 %   Neutro Abs 2.2 1.7 - 7.7 K/uL   Lymphocytes Relative 57 %   Lymphs Abs 4.0 0.7 - 4.0 K/uL   Monocytes Relative 11 %   Monocytes Absolute 0.8 0.1 - 1.0 K/uL   Eosinophils Relative 1 %   Eosinophils Absolute 0.1 0.0 - 0.5 K/uL   Basophils Relative 0 %   Basophils Absolute 0.0 0.0 - 0.1 K/uL   Immature Granulocytes 0 %   Abs Immature Granulocytes  0.02 0.00 - 0.07 K/uL  Comprehensive metabolic panel  Result Value Ref Range   Sodium 135 135 - 145 mmol/L   Potassium 3.5 3.5 - 5.1 mmol/L   Chloride 100 98 - 111 mmol/L   CO2 25 22 - 32 mmol/L  Glucose, Bld 104 (H) 70 - 99 mg/dL   BUN 7 6 - 20 mg/dL   Creatinine, Ser 1.51 (H) 0.61 - 1.24 mg/dL   Calcium 9.5 8.9 - 76.1 mg/dL   Total Protein 8.0 6.5 - 8.1 g/dL   Albumin 4.6 3.5 - 5.0 g/dL   AST 22 15 - 41 U/L   ALT 19 0 - 44 U/L   Alkaline Phosphatase 66 38 - 126 U/L   Total Bilirubin 0.9 0.3 - 1.2 mg/dL   GFR, Estimated >60 >73 mL/min   Anion gap 10 5 - 15  Lipase, blood  Result Value Ref Range   Lipase 30 11 - 51 U/L  Urinalysis, Routine w reflex microscopic -Urine, Clean Catch  Result Value Ref Range   Color, Urine STRAW (A) YELLOW   APPearance CLEAR CLEAR   Specific Gravity, Urine 1.005 1.005 - 1.030   pH 7.0 5.0 - 8.0   Glucose, UA NEGATIVE NEGATIVE mg/dL   Hgb urine dipstick NEGATIVE NEGATIVE   Bilirubin Urine NEGATIVE NEGATIVE   Ketones, ur NEGATIVE NEGATIVE mg/dL   Protein, ur NEGATIVE NEGATIVE mg/dL   Nitrite NEGATIVE NEGATIVE   Leukocytes,Ua NEGATIVE NEGATIVE  Rapid urine drug screen (hospital performed)  Result Value Ref Range   Opiates NONE DETECTED NONE DETECTED   Cocaine NONE DETECTED NONE DETECTED   Benzodiazepines NONE DETECTED NONE DETECTED   Amphetamines NONE DETECTED NONE DETECTED   Tetrahydrocannabinol POSITIVE (A) NONE DETECTED   Barbiturates NONE DETECTED NONE DETECTED  D-dimer, quantitative  Result Value Ref Range   D-Dimer, Quant <0.27 0.00 - 0.50 ug/mL-FEU  CBG monitoring, ED  Result Value Ref Range   Glucose-Capillary 103 (H) 70 - 99 mg/dL   DG Chest 2 View  Result Date: 07/28/2022 CLINICAL DATA:  Shortness of breath. EXAM: CHEST - 2 VIEW COMPARISON:  July 16, 2019 FINDINGS: The heart size and mediastinal contours are within normal limits. Both lungs are clear. The visualized skeletal structures are unremarkable. IMPRESSION: No active  cardiopulmonary disease. Electronically Signed   By: Aram Candela M.D.   On: 07/28/2022 23:48     EKG EKG Interpretation  Date/Time:  Wednesday July 28 2022 23:25:16 EST Ventricular Rate:  72 PR Interval:  152 QRS Duration: 90 QT Interval:  374 QTC Calculation: 409 R Axis:   16 Text Interpretation: Normal sinus rhythm Left ventricular hypertrophy Nonspecific T wave abnormality No previous ECGs available Confirmed by Cathren Laine ([id number]) on 07/29/2022 9:49:29 AM  Radiology DG Chest 2 View  Result Date: 07/28/2022 CLINICAL DATA:  Shortness of breath. EXAM: CHEST - 2 VIEW COMPARISON:  July 16, 2019 FINDINGS: The heart size and mediastinal contours are within normal limits. Both lungs are clear. The visualized skeletal structures are unremarkable. IMPRESSION: No active cardiopulmonary disease. Electronically Signed   By: Aram Candela M.D.   On: 07/28/2022 23:48    Procedures Procedures    Medications Ordered in ED Medications  sodium chloride 0.9 % bolus 1,000 mL (has no administration in time range)  amLODipine (NORVASC) tablet 5 mg (has no administration in time range)  hydrochlorothiazide (HYDRODIURIL) tablet 12.5 mg (has no administration in time range)    ED Course/ Medical Decision Making/ A&P                             Medical Decision Making Problems Addressed: Essential hypertension: chronic illness or injury with exacerbation, progression, or side effects of treatment that poses a threat to  life or bodily functions Nausea: acute illness or injury    Details: Acute on chronic, recurrent. Uncontrolled hypertension: acute illness or injury that poses a threat to life or bodily functions  Amount and/or Complexity of Data Reviewed External Data Reviewed: notes. Labs: ordered. Decision-making details documented in ED Course. Radiology: ordered and independent interpretation performed. Decision-making details documented in ED Course. ECG/medicine tests:  ordered and independent interpretation performed. Decision-making details documented in ED Course.  Risk OTC drugs. Prescription drug management. Decision regarding hospitalization.   Iv ns. Continuous pulse ox and cardiac monitoring. Labs ordered/sent. Imaging ordered.   Differential diagnosis includes uncontrolled hypertension, aki, dehydration, etc . Dispo decision including potential need for admission considered - will get labs and imaging and reassess.   Reviewed nursing notes and prior charts for additional history. External reports reviewed.  Pt with gi eval/egd for similar nausea symptoms ~ 4 months ago - egd normal. Noted w hx thc use - ?possible chs as cause of recurrent gi symptoms.   On review prior records, at office visits, bp often in 150/100 range or sl higher.   Hctz po, amlodipine po.   Ns bolus. Zofran iv.   Cardiac monitor: sinus rhythm, rate 78.  Labs reviewed/interpreted by me - wbc and hgb normal. Chem normal. Ddimer normal.   Xrays reviewed/interpreted by me - no edema.   Po fluids/food - no recurrent nausea/vomiting.   Abd soft nt. Breathing comfortably. Rr 14, pulse ox 100%. No chest pain.   Recheck, bp still high - will admit. Medicine consulted for admission.        Final Clinical Impression(s) / ED Diagnoses Final diagnoses:  None    Rx / DC Orders ED Discharge Orders     None          Lajean Saver, MD 07/29/22 [phone]

## 2022-07-29 NOTE — ED Notes (Signed)
I notified Dr Ashok Cordia of pt's elevated bp and he placed orders for meds.

## 2022-07-29 NOTE — Discharge Instructions (Addendum)
It was our pleasure to provide your ER care today - we hope that you feel better.  Drink plenty of fluids/stay well hydrated.   Your blood pressure is high today - continue your med, also take amlodipine as prescribed, limit salt intake and follow heart health eating plan, and follow up closely with primary care doctor in the coming week.  Note that increasingly we are seeing a recurrent nausea/vomiting and/or upper abdominal pain syndrome called Cannabinoid Hyperemesis Syndrome - see attached info - in these cases, avoiding marijuana use will prevent symptoms from recurring.   Return to ER if worse, new symptoms, fevers, new/severe pain, chest pain, trouble breathing, persistent vomiting, or other concern.

## 2022-07-29 NOTE — ED Notes (Signed)
ED TO INPATIENT HANDOFF REPORT  ED Nurse Name and Phone #: Maryfrances Bunnell, RN [phone]  S Name/Age/Gender Mark Washington 37 y.o. male Room/Bed: 039C/039C  Code Status   Code Status: Full Code  Home/SNF/Other Home Patient oriented to: self, place, time, and situation Is this baseline? Yes   Triage Complete: Triage complete  Chief Complaint Asymptomatic hypertensive urgency [I16.0]  Triage Note Pt c/o feeling like his food is getting stuck and shob. Pt states that he hasn't ate in 3 days.    Allergies Allergies  Allergen Reactions   Iodine Anaphylaxis   Shellfish Allergy Anaphylaxis   Oxycodone-Acetaminophen Itching and Rash    Level of Care/Admitting Diagnosis ED Disposition     ED Disposition  Admit   Condition  --   Comment  Hospital Area: [facility] [[id number]]  Level of Care: Telemetry Medical [104]  May place patient in observation at [facility] or [facility] if equivalent level of care is available:: No  Covid Evaluation: Asymptomatic - no recent exposure (last 10 days) testing not required  Diagnosis: Asymptomatic hypertensive urgency [5809983]  Admitting Physician: Earl Lagos [[id number]]  Attending Physician: Earl Lagos [[id number]]          B Medical/Surgery History Past Medical History:  Diagnosis Date   Anxiety    Asthma    HIV (human immunodeficiency virus infection) (HCC) 01/25/2018   Hypertension    Lupus (HCC)    History reviewed. No pertinent surgical history.   A IV Location/Drains/Wounds Patient Lines/Drains/Airways Status     Active Line/Drains/Airways     Name Placement date Placement time Site Days   Peripheral IV 07/29/22 20 G Left Antecubital 07/29/22  0932  Antecubital  less than 1            Intake/Output Last 24 hours  Intake/Output Summary (Last 24 hours) at 07/29/2022 1947 Last data filed at 07/29/2022 1240 Gross per 24 hour  Intake 1000 ml  Output --  Net 1000 ml     Labs/Imaging Results for orders placed or performed during the hospital encounter of 07/28/22 (from the past 48 hour(s))  CBC with Differential     Status: None   Collection Time: 07/28/22 11:28 PM  Result Value Ref Range   WBC 7.1 4.0 - 10.5 K/uL   RBC 5.61 4.22 - 5.81 MIL/uL   Hemoglobin 16.8 13.0 - 17.0 g/dL   HCT 93.7 90.2 - 40.9 %   MCV 87.5 80.0 - 100.0 fL   MCH 29.9 26.0 - 34.0 pg   MCHC 34.2 30.0 - 36.0 g/dL   RDW 73.5 32.9 - 92.4 %   Platelets 178 150 - 400 K/uL   nRBC 0.0 0.0 - 0.2 %   Neutrophils Relative % 31 %   Neutro Abs 2.2 1.7 - 7.7 K/uL   Lymphocytes Relative 57 %   Lymphs Abs 4.0 0.7 - 4.0 K/uL   Monocytes Relative 11 %   Monocytes Absolute 0.8 0.1 - 1.0 K/uL   Eosinophils Relative 1 %   Eosinophils Absolute 0.1 0.0 - 0.5 K/uL   Basophils Relative 0 %   Basophils Absolute 0.0 0.0 - 0.1 K/uL   Immature Granulocytes 0 %   Abs Immature Granulocytes 0.02 0.00 - 0.07 K/uL    Comment: Performed at [facility], [address], [place], [place] [address]  Comprehensive metabolic panel     Status: Abnormal   Collection Time: 07/28/22 11:28 PM  Result Value Ref Range   Sodium 135  135 - 145 mmol/L   Potassium 3.5 3.5 - 5.1 mmol/L   Chloride 100 98 - 111 mmol/L   CO2 25 22 - 32 mmol/L   Glucose, Bld 104 (H) 70 - 99 mg/dL    Comment: Glucose reference range applies only to samples taken after fasting for at least 8 hours.   BUN 7 6 - 20 mg/dL   Creatinine, Ser 1.38 (H) 0.61 - 1.24 mg/dL   Calcium 9.5 8.9 - 10.3 mg/dL   Total Protein 8.0 6.5 - 8.1 g/dL   Albumin 4.6 3.5 - 5.0 g/dL   AST 22 15 - 41 U/L   ALT 19 0 - 44 U/L   Alkaline Phosphatase 66 38 - 126 U/L   Total Bilirubin 0.9 0.3 - 1.2 mg/dL   GFR, Estimated >60 >60 mL/min    Comment: (NOTE) Calculated using the CKD-EPI Creatinine Equation (2021)    Anion gap 10 5 - 15    Comment: Performed at [facility] [address] [place], [place] [address]  Lipase, blood     Status: None    Collection Time: 07/28/22 11:28 PM  Result Value Ref Range   Lipase 30 11 - 51 U/L    Comment: Performed at [facility], [place] [address] [place], [place] [address]  Resp panel by RT-PCR (RSV, Flu A&B, Covid) Anterior Nasal Swab     Status: None   Collection Time: 07/29/22  9:30 AM   Specimen: Anterior Nasal Swab  Result Value Ref Range   SARS Coronavirus 2 by RT PCR NEGATIVE NEGATIVE    Comment: (NOTE) SARS-CoV-2 target nucleic acids are NOT DETECTED.  The SARS-CoV-2 RNA is generally detectable in upper respiratory specimens during the acute phase of infection. The lowest concentration of SARS-CoV-2 viral copies this assay can detect is 138 copies/mL. A negative result does not preclude SARS-Cov-2 infection and should not be used as the sole basis for treatment or other patient management decisions. A negative result may occur with  improper specimen collection/handling, submission of specimen other than nasopharyngeal swab, presence of viral mutation(s) within the areas targeted by this assay, and inadequate number of viral copies(<138 copies/mL). A negative result must be combined with clinical observations, patient history, and epidemiological information. The expected result is Negative.  Fact Sheet for Patients:  EntrepreneurPulse.com.au  Fact Sheet for Healthcare Providers:  IncredibleEmployment.be  This test is no t yet approved or cleared by the [place] FDA and  has been authorized for detection and/or diagnosis of SARS-CoV-2 by FDA under an Emergency Use Authorization (EUA). This EUA will remain  in effect (meaning this test can be used) for the duration of the COVID-19 declaration under Section 564(b)(1) of the Act, 21 U.S.C.section 360bbb-3(b)(1), unless the authorization is terminated  or revoked sooner.       Influenza A by PCR NEGATIVE NEGATIVE   Influenza B by PCR NEGATIVE NEGATIVE    Comment: (NOTE) The Xpert  Xpress SARS-CoV-2/FLU/RSV plus assay is intended as an aid in the diagnosis of influenza from Nasopharyngeal swab specimens and should not be used as a sole basis for treatment. Nasal washings and aspirates are unacceptable for Xpert Xpress SARS-CoV-2/FLU/RSV testing.  Fact Sheet for Patients: EntrepreneurPulse.com.au  Fact Sheet for Healthcare Providers: IncredibleEmployment.be  This test is not yet approved or cleared by the [place] FDA and has been authorized for detection and/or diagnosis of SARS-CoV-2 by FDA under an Emergency Use Authorization (EUA). This EUA will remain in effect (  meaning this test can be used) for the duration of the COVID-19 declaration under Section 564(b)(1) of the Act, 21 U.S.C. section 360bbb-3(b)(1), unless the authorization is terminated or revoked.     Resp Syncytial Virus by PCR NEGATIVE NEGATIVE    Comment: (NOTE) Fact Sheet for Patients: BloggerCourse.com  Fact Sheet for Healthcare Providers: SeriousBroker.it  This test is not yet approved or cleared by the [place] FDA and has been authorized for detection and/or diagnosis of SARS-CoV-2 by FDA under an Emergency Use Authorization (EUA). This EUA will remain in effect (meaning this test can be used) for the duration of the COVID-19 declaration under Section 564(b)(1) of the Act, 21 U.S.C. section 360bbb-3(b)(1), unless the authorization is terminated or revoked.  Performed at [facility], [address] [place], [place] [address]   Rapid urine drug screen (hospital performed)     Status: Abnormal   Collection Time: 07/29/22 10:12 AM  Result Value Ref Range   Opiates NONE DETECTED NONE DETECTED   Cocaine NONE DETECTED NONE DETECTED   Benzodiazepines NONE DETECTED NONE DETECTED   Amphetamines NONE DETECTED NONE DETECTED   Tetrahydrocannabinol POSITIVE (A) NONE DETECTED   Barbiturates  NONE DETECTED NONE DETECTED    Comment: (NOTE) DRUG SCREEN FOR MEDICAL PURPOSES ONLY.  IF CONFIRMATION IS NEEDED FOR ANY PURPOSE, NOTIFY LAB WITHIN 5 DAYS.  LOWEST DETECTABLE LIMITS FOR URINE DRUG SCREEN Drug Class                     Cutoff (ng/mL) Amphetamine and metabolites    1000 Barbiturate and metabolites    200 Benzodiazepine                 200 Opiates and metabolites        300 Cocaine and metabolites        300 THC                            50 Performed at [facility], [address] [place], [place] [address]   Urinalysis, Routine w reflex microscopic -Urine, Clean Catch     Status: Abnormal   Collection Time: 07/29/22 12:00 PM  Result Value Ref Range   Color, Urine STRAW (A) YELLOW   APPearance CLEAR CLEAR   Specific Gravity, Urine 1.005 1.005 - 1.030   pH 7.0 5.0 - 8.0   Glucose, UA NEGATIVE NEGATIVE mg/dL   Hgb urine dipstick NEGATIVE NEGATIVE   Bilirubin Urine NEGATIVE NEGATIVE   Ketones, ur NEGATIVE NEGATIVE mg/dL   Protein, ur NEGATIVE NEGATIVE mg/dL   Nitrite NEGATIVE NEGATIVE   Leukocytes,Ua NEGATIVE NEGATIVE    Comment: Performed at [facility], [address] [place], [place] [address]  CBG monitoring, ED     Status: Abnormal   Collection Time: 07/29/22 12:20 PM  Result Value Ref Range   Glucose-Capillary 103 (H) 70 - 99 mg/dL    Comment: Glucose reference range applies only to samples taken after fasting for at least 8 hours.  D-dimer, quantitative     Status: None   Collection Time: 07/29/22 12:55 PM  Result Value Ref Range   D-Dimer, Quant <0.27 0.00 - 0.50 ug/mL-FEU    Comment: (NOTE) At the manufacturer cut-off value of 0.5 g/mL FEU, this assay has a negative predictive value of 95-100%.This assay is intended for use in conjunction with a clinical pretest probability (PTP) assessment model to exclude pulmonary embolism (  PE) and deep venous thrombosis (DVT) in outpatients suspected of PE or DVT. Results should be correlated  with clinical presentation. Performed at [facility], [place] [address], [place], [place] [address]    DG Chest 2 View  Result Date: 07/28/2022 CLINICAL DATA:  Shortness of breath. EXAM: CHEST - 2 VIEW COMPARISON:  July 16, 2019 FINDINGS: The heart size and mediastinal contours are within normal limits. Both lungs are clear. The visualized skeletal structures are unremarkable. IMPRESSION: No active cardiopulmonary disease. Electronically Signed   By: Virgina Norfolk M.D.   On: 07/28/2022 23:48    Pending Labs Unresulted Labs (From admission, onward)     Start     Ordered   07/30/22 5643  Basic metabolic panel  Tomorrow morning,   R        07/29/22 1737   07/30/22 0500  CBC  Tomorrow morning,   R        07/29/22 1737   07/29/22 1813  Aldosterone + renin activity w/ ratio  Once,   R        07/29/22 1812   07/29/22 1732  TSH  Once,   URGENT        07/29/22 1731            Vitals/Pain Today's Vitals   07/29/22 1730 07/29/22 1800 07/29/22 1900 07/29/22 1930  BP: (!) 184/136 (!) 169/118 (!) 146/120 (!) 156/99  Pulse: 80 79  80  Resp: 20 20  17   Temp:   98.9 F (37.2 C)   TempSrc:   Oral   SpO2: 100% 96%  96%  Weight:      Height:      PainSc:        Isolation Precautions No active isolations  Medications Medications  rivaroxaban (XARELTO) tablet 10 mg (10 mg Oral Given 07/29/22 1845)  senna-docusate (Senokot-S) tablet 1 tablet (has no administration in time range)  amLODipine (NORVASC) tablet 10 mg (has no administration in time range)  hydrochlorothiazide (HYDRODIURIL) tablet 25 mg (has no administration in time range)  traZODone (DESYREL) tablet 50 mg (has no administration in time range)  sodium chloride 0.9 % bolus 1,000 mL (0 mLs Intravenous Stopped 07/29/22 1240)  amLODipine (NORVASC) tablet 5 mg (5 mg Oral Given 07/29/22 1017)  hydrochlorothiazide (HYDRODIURIL) tablet 12.5 mg (12.5 mg Oral Given 07/29/22 1017)  ondansetron (ZOFRAN) injection 4 mg (4 mg  Intravenous Given 07/29/22 1015)  hydrALAZINE (APRESOLINE) injection 10 mg (10 mg Intravenous Given 07/29/22 1247)  acetaminophen (TYLENOL) tablet 1,000 mg (1,000 mg Oral Given 07/29/22 1250)  alum & mag hydroxide-simeth (MAALOX/MYLANTA) 200-200-20 MG/5ML suspension 30 mL (30 mLs Oral Given 07/29/22 1250)  famotidine (PEPCID) tablet 20 mg (20 mg Oral Given 07/29/22 1250)  labetalol (NORMODYNE) injection 20 mg (20 mg Intravenous Given 07/29/22 1626)  hydrochlorothiazide (HYDRODIURIL) tablet 12.5 mg (12.5 mg Oral Given 07/29/22 1946)  amLODipine (NORVASC) tablet 5 mg (5 mg Oral Given 07/29/22 1945)    Mobility walks     Focused Assessments Cardiac Assessment Handoff:  Cardiac Rhythm: Normal sinus rhythm Lab Results  Component Value Date   CKTOTAL 147 05/03/2018   Lab Results  Component Value Date   DDIMER <0.27 07/29/2022   Does the Patient currently have chest pain? Yes    R Recommendations: See Admitting Provider Note  Report given to:   Additional Notes: Home medications are being put in to help with blood pressure. Just gave amlodipine and HCTZ.

## 2022-07-29 NOTE — ED Notes (Signed)
Pt stated he has SOB every time he lye back at approx 75 degree angle. Pt stated he has non radiating left sided sharp chest pain that just started. I notified Dr Ashok Cordia and I did another EKG

## 2022-07-29 NOTE — H&P (Addendum)
Date: 07/29/2022               Patient Name:  Mark Washington MRN: [id number]  DOB: Mar 08, 1986 Age / Sex: 37 y.o., male   PCP: Patient, No Pcp Per         Medical Service: Internal Medicine Teaching Service         Attending Physician: Dr. Dareen Piano      First Contact: Dr. Gaylyn Rong, MD Pager [phone]    Second Contact: Dr. Buddy Duty, DO Pager [phone]         After Hours (After 5p/  First Contact Pager: [phone]  weekends / holidays): Second Contact Pager: [phone]   SUBJECTIVE   Chief Complaint: Chest discomfort, dizziness  History of Present Illness: Mark Washington is a 37 yo male with hypertension (since age 59), tachycardia, bipolar/depression, asthma, HIV who presented after an episode of dizziness, chest pressure.  He was having chest pressure, trouble digesting and that is why he came in- all of this started last night all of a sudden. Pain is worse when he is moving around and improves when he rests. Feels like something is sitting on his chest, but the pain does not radiate. Upon interview, he feels a funny feeling in his heart that causes him to get short of breath and lightheaded.  He gets lightheaded/dizzy when changing positions. The patient also is experiencing heart palpitations, dyspnea on exertion.  Also has not had an appetite and has not eaten in 4 days- feels like food gets stuck. Denies heart burn/reflux, but does occassionally vomit some food up. Has been staying hydrated.Has not slept since Tuesday.   Previously hospitalized for covid, asthma, nerve damage, chronic pain.  He does note that he was diagnosed with "tachycardia" when he was much younger and that they gave him a sublingual medicine to abort his tachycardia, wonder if this is adenosine or not.  Additionally, he mentions that when he was hospitalized for COVID, he was intubated for 9 days and then when he woke up he had intermittent dysfunction of his legs.  He says occasionally, his legs just  "give out" under him.  He at times uses a walker or cane.  He has not had any recent new focal neurological deficits.  He says he has been treated for hypertension since he was 30 and that he has an extensive family history of cardiac conditions, renal failure, diabetes, and cardiovascular events.  He is not sure whether there was any sudden cardiac death in his family.  He has had prior episodes of syncope, but this was a long time ago.  Additionally, he mentions infrequent dysphagia where he feels like food is getting stuck in his throat.  He has had prior EGD with normal results in 2023.  He does occasionally have episodes of vomiting right after he eats.  Meds:  HCTZ 25  Trazodone 50 mg qhs Lyrica 50 mg  Wellbutrin 150 mg  Stopped off amlodipine and lisinopril previously Asked about Triumeq but he mentions he is not taking this medicine Current Meds  Medication Sig   amLODipine (NORVASC) 5 MG tablet Take 1 tablet (5 mg total) by mouth daily.    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   OBJECTIVE:   Physical Exam: Blood pressure (!) 173/128, pulse 85, temperature 98.8 F (37.1 C), temperature source Oral, resp. rate 18, height 5\' 4"  (1.626  m), weight 89 kg, SpO2 99 %.  Constitutional: NAD Cardiovascular: regular rate and rhythm, no m/r/g Pulmonary/Chest: normal work of breathing on room air, lungs clear to auscultation bilaterally Abdominal: soft, non-tender, non-distended Neurological: alert & oriented x 3, 5/5 strength in bilateral upper and lower extremities, normal gait Psych: Quick speech, nervous affect.  Labs: CBC    Component Value Date/Time   WBC 7.1 07/28/2022 2328   RBC 5.61 07/28/2022 2328   HGB 16.8 07/28/2022 2328   HCT 49.1 07/28/2022 2328   PLT 178 07/28/2022 2328   MCV 87.5 07/28/2022 2328   MCH 29.9 07/28/2022 2328   MCHC 34.2 07/28/2022 2328   RDW 12.8 07/28/2022 2328   LYMPHSABS 4.0 07/28/2022 2328   MONOABS 0.8 07/28/2022 2328   EOSABS 0.1 07/28/2022 2328   BASOSABS 0.0 07/28/2022 2328     CMP     Component Value Date/Time   NA 135 07/28/2022 2328   K 3.5 07/28/2022 2328   CL 100 07/28/2022 2328   CO2 25 07/28/2022 2328   GLUCOSE 104 (H) 07/28/2022 2328   BUN 7 07/28/2022 2328   CREATININE 1.38 (H) 07/28/2022 2328   CREATININE 1.43 (H) 02/07/2019 1606   CALCIUM 9.5 07/28/2022 2328   PROT 8.0 07/28/2022 2328   ALBUMIN 4.6 07/28/2022 2328   AST 22 07/28/2022 2328   ALT 19 07/28/2022 2328   ALKPHOS 66 07/28/2022 2328   BILITOT 0.9 07/28/2022 2328   GFRNONAA >60 07/28/2022 2328   GFRNONAA 65 02/07/2019 1606   GFRAA 75 02/07/2019 1606  Lipase 30 COVID-negative, flu negative UDS positive for THC UA unremarkable D-dimer unremarkable   Imaging: CXR unremarkable for any acute or chronic process  EKG: personally reviewed my interpretation is NSR with inverted T waves in anterolateral leads  ASSESSMENT & PLAN:  Mark Washington is a 37 y.o. person living with a history of HIV, asthma, bipolar disorder, tachycardia, OSA, HTN who presented with chest discomfort, dizziness and admitted for severe asymptomatic hypertension on hospital day 0  Severe symptomatic hypertension History  of possible SVT Uncontrolled hypertension History of OSA Patient with history of likely untreated hypertension, possible prior episodes of SVT, one of which he says required intervention likely with adenosine, extensive family history of cardiac disease, cardiovascular events, diabetes, and renal failure presenting with few days of worsening exertional intermittent chest discomfort and dizziness as well as prior history of syncope without prodrome.  He does not currently have chest pain at the time of assessment.  He is presenting with asymptomatic severe hypertension which is resistant to initial stabilization efforts. Exam is overall unremarkable.  He does have some T wave inversions on initial EKG.  Rest of initial workup is overall unremarkable.  Low concern for ACS at time of assessment, but he does have personal and family history which would require further investigation into cardiogenic etiologies of his symptoms.  I suspect his anxiety and psychiatric conditions also likely  playing a role in his current hypertension as well. -Follow-up troponin, renin/aldosterone ratio - Follow-up TSH.  Of note, he had A1c of 5.8 last year and LDL of 155. Is not 40 yet, but ASCVD would qualify for moderate-high intensity statin if he were. Would consider initiation given personal risk and family history - Follow-up echo.  If regional abnormalities, might consider cardiology follow-up and cardiac MRI. - Cardiac monitor, consider outpatient monitor as well at discharge - Continue home HCTZ 25, uptitrate amlodipine to 10 mg daily tomorrow.  Of note he does mention that he had prior reaction to ACE/ARB and that they were discontinued. - No PRNs unless severe HTN and symptomatic - Of note, does have history of OSA confirmed with sleep study, need to confirm whether he is CPAP compliant and start during this admission - If any of above workup return abnormal, would consider cardiology evaluation  HIV Note from July  2023 mentions well-controlled HIV on Triumeq and Biktarvy.  When questioned, he mentioned he does not take Triumeq.  Needs to follow-up again with HIV clinic at [facility].  No acute reason to further evaluate during this admission, do not think his HIV is related to his presentation.  Asthma See albuterol and Symbicort on med list.  Not sure if he takes this regularly.  He mentioned he did try albuterol when he started experiencing dyspnea but it did not improve his symptoms.  No wheezing on exam and respiratory status stable on room air during initial evaluation.  No acute concerns at this time for asthma exacerbation.  Bipolar disorder Taking Wellbutrin 150, trazodone 50 nightly for what he mentions his depression.  However, he does endorse sleeping very little for past 2-3 nights.  He also says he has had previous episodes of what seems like mania.  Wellbutrin would not be appropriate treatment if his true diagnosis was bipolar disorder.  Not acutely manic or psychotic, will have psychiatry evaluate nonemergently and change regimen. -Continue trazodone 50 mg nightly.  Holding Wellbutrin 150 until psychiatry evaluation.  Infrequent dysphagia EGD with normal findings in 2023.  No acute interventions needed.  Will follow-up if he continues to have dysphagia during this admission.  Possible globus sensation.  Weakness Has had ongoing intermittent lower extremity weakness going on for a few years.  Has previously been evaluated by rheumatology and neurology.  He mentions this started during COVID hospitalization, but some of his visits were prior to that.  Notes report that this is likely functional. - PT/OT evaluation  Diet: Normal VTE:  Xarelto IVF: LR,75cc/hr Code: DNR  Prior to Admission Living Arrangement: Home, living by himself Anticipated Discharge Location: Home, pending PT eval Barriers to Discharge: Ongoing workup  Dispo: Admit patient to Observation with expected length of stay less than  2 midnights.    07/29/2022, 5:05 PM   Dr. Neldon Labella, MD Pager [phone] Internal medicine PGY 1

## 2022-07-29 NOTE — Plan of Care (Signed)
  Problem: Nutrition: Goal: Adequate nutrition will be maintained Outcome: Progressing   Problem: Coping: Goal: Level of anxiety will decrease Outcome: Progressing

## 2022-07-29 NOTE — ED Notes (Signed)
Spoke with [place] from lab regarding patient's 2nd troponin. Since first lab was drawn at [place], the second one will not need to be drawn until 2057.

## 2022-07-29 NOTE — Hospital Course (Addendum)
Severe symptomatic hypertension History of possible SVT Uncontrolled hypertension History of OSA Patient with history of likely untreated hypertension, possible prior episodes of SVT, one of which he says required intervention likely with adenosine, extensive family history of cardiac disease, cardiovascular events, diabetes, and renal failure presented with with asymptomatic severe hypertension which is resistant to initial stabilization efforts. He did have some T wave inversions on initial EKG. Trops negative. Low concern for ACS at time of assessment, but he does have personal and family history which would require further investigation into cardiogenic etiologies of his symptoms, thus echo was obtained. Echo revealed normal EF, no wall motion abnormalities, and normal LV diastolic parameters. TSH within normal limits, renin-aldo level pending. Of note, he had A1c of 5.8 last year and LDL of 155. Is not 40 yet, but ASCVD would qualify for moderate-high intensity statin if he were, thus was started on lipitor 20 mg daily. He was continued on his home HCZ 25 mg and started on amlodipine 10 mg daily for BP.  Of note he does mention that he had prior reaction to ACE/ARB and that they were discontinued. Renal dopplers were obtained given the patient's history of having hypertension at such a young age and this did not show any renal artery stenosis. BP was improved by the day of discharge, although still elevated. Suspect the patient may need additional antihypertensive therapy in the outpatient setting.    HIV Patient states that he is not on both Triumeq and Biktarvy but on [place] alone and that he was taken off his Triumeq by his ID physician.  States that his HIV has been well-controlled on this medication. Continued Biktarvy and patient will need to follow up with HIV clinic [facility] next month.   Vertigo: Patient states that when he changes position he notices the room spinning.  The sensation lasts for  a few seconds and then resolved.  Patient also states that he gets similar symptoms when he moves his head from side-to-side. Gave meclizine which helped his symptoms and he is also recommended to get vestibular rehab in the outpatient setting.    Bipolar II disorder Taking Wellbutrin 150, trazodone 50 nightly for what he mentions his depression.  However, he does endorse sleeping very little for past 2-3 nights.  He also says he has had previous episodes of what seems like mania/hypomania. Wellbutrin was discontinued in the setting of new diagnosis of bipolar 2 and he was started on mirtazapine 7.5 mg qhs and depakote 250 mg. The patient was tolerating these well and states that he likes how they make him feel. Advised patient to only take his trazodone if absolutely needed, due to risk of serotonin syndrome with his other medications. Also recommend he follow up with his psychiatrist in the outpatient setting.    Infrequent dysphagia EGD with normal findings in 2023.  No acute interventions needed.  Will follow-up if he continues to have dysphagia during this admission.  Possible globus sensation.   Weakness Has had ongoing intermittent lower extremity weakness going on for a few years.  Has previously been evaluated by rheumatology and neurology.  He mentions this started during [place] hospitalization, but some of his visits were prior to that.  Notes report that this is likely functional. He will be getting physical therapy in the outpatient setting and DME order for cane was placed.

## 2022-07-30 ENCOUNTER — Observation Stay (HOSPITAL_BASED_OUTPATIENT_CLINIC_OR_DEPARTMENT_OTHER): Payer: Medicaid Other

## 2022-07-30 DIAGNOSIS — I160 Hypertensive urgency: Secondary | ICD-10-CM | POA: Diagnosis not present

## 2022-07-30 DIAGNOSIS — R0789 Other chest pain: Secondary | ICD-10-CM | POA: Diagnosis not present

## 2022-07-30 DIAGNOSIS — F1220 Cannabis dependence, uncomplicated: Secondary | ICD-10-CM | POA: Insufficient documentation

## 2022-07-30 DIAGNOSIS — B20 Human immunodeficiency virus [HIV] disease: Secondary | ICD-10-CM

## 2022-07-30 DIAGNOSIS — R42 Dizziness and giddiness: Secondary | ICD-10-CM | POA: Diagnosis not present

## 2022-07-30 DIAGNOSIS — F310 Bipolar disorder, current episode hypomanic: Secondary | ICD-10-CM | POA: Insufficient documentation

## 2022-07-30 DIAGNOSIS — R079 Chest pain, unspecified: Secondary | ICD-10-CM | POA: Diagnosis not present

## 2022-07-30 DIAGNOSIS — R630 Anorexia: Secondary | ICD-10-CM | POA: Insufficient documentation

## 2022-07-30 DIAGNOSIS — G4700 Insomnia, unspecified: Secondary | ICD-10-CM | POA: Insufficient documentation

## 2022-07-30 LAB — CBC
HCT: 52.3 % — ABNORMAL HIGH (ref 39.0–52.0)
Hemoglobin: 17.9 g/dL — ABNORMAL HIGH (ref 13.0–17.0)
MCH: 30 pg (ref 26.0–34.0)
MCHC: 34.2 g/dL (ref 30.0–36.0)
MCV: 87.6 fL (ref 80.0–100.0)
Platelets: 167 10*3/uL (ref 150–400)
RBC: 5.97 MIL/uL — ABNORMAL HIGH (ref 4.22–5.81)
RDW: 13 % (ref 11.5–15.5)
WBC: 6.6 10*3/uL (ref 4.0–10.5)
nRBC: 0 % (ref 0.0–0.2)

## 2022-07-30 LAB — BASIC METABOLIC PANEL
Anion gap: 8 (ref 5–15)
BUN: 5 mg/dL — ABNORMAL LOW (ref 6–20)
CO2: 29 mmol/L (ref 22–32)
Calcium: 10 mg/dL (ref 8.9–10.3)
Chloride: 99 mmol/L (ref 98–111)
Creatinine, Ser: 1.4 mg/dL — ABNORMAL HIGH (ref 0.61–1.24)
GFR, Estimated: >60 mL/min (ref >60)
Glucose, Bld: 122 mg/dL — ABNORMAL HIGH (ref 70–99)
Potassium: 3.9 mmol/L (ref 3.5–5.1)
Sodium: 136 mmol/L (ref 135–145)

## 2022-07-30 LAB — ECHOCARDIOGRAM COMPLETE
Area-P 1/2: 2.73 cm2
Height: 64 in
S' Lateral: 2.5 cm
Weight: 3139.35 oz

## 2022-07-30 MED ORDER — SODIUM CHLORIDE 0.9 % IV SOLN
8.0000 mg | Freq: Four times a day (QID) | INTRAVENOUS | Status: DC | PRN
  Filled 2022-07-30: qty 4

## 2022-07-30 MED ORDER — MOMETASONE FURO-FORMOTEROL FUM 100-5 MCG/ACT IN AERO
2.0000 | INHALATION_SPRAY | Freq: Two times a day (BID) | RESPIRATORY_TRACT | Status: DC
  Filled 2022-07-30: qty 8.8

## 2022-07-30 MED ORDER — DIVALPROEX SODIUM ER 500 MG PO TB24
500.0000 mg | ORAL_TABLET | Freq: Once | ORAL | Status: AC
  Administered 2022-07-30: 500 mg via ORAL
  Filled 2022-07-30: qty 1

## 2022-07-30 MED ORDER — POLYETHYLENE GLYCOL 3350 17 G PO PACK
17.0000 g | PACK | Freq: Every day | ORAL | Status: DC
  Filled 2022-07-30 (×2): qty 1

## 2022-07-30 MED ORDER — ADULT MULTIVITAMIN W/MINERALS CH
1.0000 | ORAL_TABLET | Freq: Every day | ORAL | Status: DC
  Administered 2022-07-30: 1 via ORAL
  Filled 2022-07-30: qty 1

## 2022-07-30 MED ORDER — TRAZODONE HCL 50 MG PO TABS: 50.0000 mg | ORAL_TABLET | Freq: Every evening | ORAL | Status: DC | PRN

## 2022-07-30 MED ORDER — PERFLUTREN LIPID MICROSPHERE
1.0000 mL | INTRAVENOUS | Status: AC | PRN
  Administered 2022-07-30: 2 mL via INTRAVENOUS

## 2022-07-30 MED ORDER — SENNA 8.6 MG PO TABS
2.0000 | ORAL_TABLET | Freq: Every day | ORAL | Status: DC
  Administered 2022-07-30: 17.2 mg via ORAL
  Filled 2022-07-30: qty 2

## 2022-07-30 MED ORDER — ACETAMINOPHEN 325 MG PO TABS
650.0000 mg | ORAL_TABLET | Freq: Four times a day (QID) | ORAL | Status: DC | PRN
  Administered 2022-07-30: 650 mg via ORAL
  Filled 2022-07-30: qty 2

## 2022-07-30 MED ORDER — BICTEGRAVIR-EMTRICITAB-TENOFOV 50-200-25 MG PO TABS
1.0000 | ORAL_TABLET | Freq: Every day | ORAL | Status: DC
  Administered 2022-07-30 – 2022-07-31 (×2): 1 via ORAL
  Filled 2022-07-30 (×2): qty 1

## 2022-07-30 MED ORDER — MOMETASONE FURO-FORMOTEROL FUM 100-5 MCG/ACT IN AERO
2.0000 | INHALATION_SPRAY | Freq: Two times a day (BID) | RESPIRATORY_TRACT | Status: DC
  Administered 2022-07-30 – 2022-07-31 (×2): 2 via RESPIRATORY_TRACT
  Filled 2022-07-30: qty 8.8

## 2022-07-30 MED ORDER — MIRTAZAPINE 15 MG PO TABS
7.5000 mg | ORAL_TABLET | Freq: Every day | ORAL | Status: DC
  Administered 2022-07-30: 7.5 mg via ORAL
  Filled 2022-07-30: qty 1

## 2022-07-30 MED ORDER — ATORVASTATIN CALCIUM 10 MG PO TABS
20.0000 mg | ORAL_TABLET | Freq: Every day | ORAL | Status: DC
  Administered 2022-07-30 – 2022-07-31 (×2): 20 mg via ORAL
  Filled 2022-07-30 (×2): qty 2

## 2022-07-30 MED ORDER — IPRATROPIUM-ALBUTEROL 0.5-2.5 (3) MG/3ML IN SOLN: 3.0000 mL | Freq: Four times a day (QID) | RESPIRATORY_TRACT | Status: DC | PRN

## 2022-07-30 MED ORDER — MOMETASONE FURO-FORMOTEROL FUM 100-5 MCG/ACT IN AERO: 2.0000 | INHALATION_SPRAY | Freq: Two times a day (BID) | RESPIRATORY_TRACT | Status: DC

## 2022-07-30 MED ORDER — DIVALPROEX SODIUM ER 250 MG PO TB24
250.0000 mg | ORAL_TABLET | Freq: Every day | ORAL | Status: DC
  Filled 2022-07-30: qty 1

## 2022-07-30 NOTE — Evaluation (Signed)
Physical Therapy Evaluation Patient Details Name: Mark Washington MRN: [id number] DOB: 1985-09-14 Today's Date: 07/30/2022  History of Present Illness  Mark Washington is a 37 yo male presented to ED 1/24 after an episode of dizziness, chest pressure. PMHx: hypertension (since age 48), tachycardia, bipolar/depression, asthma, HIV.  Clinical Impression  Pt admitted with above diagnosis. Independent at baseline with use of SPC primarily, occasional rollator use, reports multiple falls per week at baseline. Has completed OPPT in the past but >2 years ago. Agreeable to OPPT again. Pt has episodes of LE weakness Lt>Rt that can last from 2 days -2 weeks. States he has good family/friend support that can come help him as needed. Supervision for safety with ambulation today. Agreeable to try stairs tomorrow.  Pt currently with functional limitations due to the deficits listed below (see PT Problem List). Pt will benefit from skilled PT to increase their independence and safety with mobility to allow discharge to the venue listed below.          Recommendations for follow up therapy are one component of a multi-disciplinary discharge planning process, led by the attending physician.  Recommendations may be updated based on patient status, additional functional criteria and insurance authorization.  Follow Up Recommendations Outpatient PT (Neuro PT [facility]))      Assistance Recommended at Discharge PRN  Patient can return home with the following  Assist for transportation;Help with stairs or ramp for entrance    Equipment Recommendations [place] (Out of town, does not have his own cane here.)  Recommendations for Other Services       Functional Status Assessment Patient has had a recent decline in their functional status and demonstrates the ability to make significant improvements in function in a reasonable and predictable amount of time.     Precautions / Restrictions  Precautions Precautions: Fall Restrictions Weight Bearing Restrictions: No      Mobility  Bed Mobility Overal bed mobility: Modified Independent             General bed mobility comments: extra time    Transfers Overall transfer level: Needs assistance Equipment used: Rolling walker (2 wheels) Transfers: Sit to/from Stand Sit to Stand: Supervision           General transfer comment: Supervision for safety, slow to rise, no assist needed. Good control to sit.    Ambulation/Gait Ambulation/Gait assistance: Supervision Gait Distance (Feet): 20 Feet Assistive device: Rolling walker (2 wheels) Gait Pattern/deviations: Step-through pattern Gait velocity: decr Gait velocity interpretation: <1.31 ft/sec, indicative of household ambulator Pre-gait activities: Standing march, weight shift General Gait Details: [place] WNL although a little slow and guarded. RW for support due to decreased confidence and reports of multiple falls per week. No overt LOB noted with dynamic gait. Declines further distance, pt worried about BP.  Stairs Stairs:  (willing to try tomorrow)          Wheelchair Mobility    Modified Rankin (Stroke Patients Only)       Balance Overall balance assessment: Needs assistance Sitting-balance support: No upper extremity supported, Feet supported Sitting balance-Leahy Scale: Normal     Standing balance support: No upper extremity supported Standing balance-Leahy Scale: Fair Standing balance comment: Initiated BERG however decr effort and was d/c.                             Pertinent Vitals/Pain Pain Assessment Pain Assessment: 0-10 Pain Score: 10-Worst pain ever Pain Location:  BIL LEs Pain Descriptors / Indicators: Aching Pain Intervention(s): Monitored during session, Repositioned    Home Living Family/patient expects to be discharged to:: Private residence Living Arrangements: Alone Available Help at Discharge:  Family;Friend(s) Type of Home: Mobile home Home Access: Stairs to enter Entrance Stairs-Rails: Psychiatric nurse of Steps: 5   Home Layout: One level Home Equipment: Cane - single point;Rollator (4 wheels)      Prior Function Prior Level of Function : Driving;Independent/Modified Independent       Physical Assist : Mobility (physical)     Mobility Comments: States he uses rollator and SPC to mobilize. 2-3 falls per week.       Hand Dominance   Dominant Hand: Right    Extremity/Trunk Assessment   Upper Extremity Assessment Upper Extremity Assessment: Defer to OT evaluation    Lower Extremity Assessment Lower Extremity Assessment: Generalized weakness (Low effort - left seems weaker than Rt, not consistent with MMT. Subjective pain with touch/resistance)       Communication   Communication: No difficulties  Cognition Arousal/Alertness: Awake/alert Behavior During Therapy: WFL for tasks assessed/performed Overall Cognitive Status: Within Functional Limits for tasks assessed                                          General Comments General comments (skin integrity, edema, etc.): Multiple symptoms reported since critical illness with COVID several years ago. Reports lingering LLE weakness, paraesthesias and numbness in hands and feet, tremors LUE, Falls, fatigue, weakness that can last 2 days - 2 weeks that spontaneously presents and resolves. Seems to be alleviated by rest.    Exercises     Assessment/Plan    PT Assessment Patient needs continued PT services  PT Problem List Decreased strength;Decreased activity tolerance;Decreased balance;Decreased mobility;Cardiopulmonary status limiting activity;Impaired sensation;Pain       PT Treatment Interventions DME instruction;Gait training;Stair training;Functional mobility training;Therapeutic activities;Therapeutic exercise;Balance training;Neuromuscular re-education;Patient/family  education    PT Goals (Current goals can be found in the Care Plan section)  Acute Rehab PT Goals Patient Stated Goal: Get well PT Goal Formulation: With patient Time For Goal Achievement: 08/06/22 Potential to Achieve Goals: Good    Frequency Min 3X/week     Co-evaluation               AM-PAC PT "6 Clicks" Mobility  Outcome Measure Help needed turning from your back to your side while in a flat bed without using bedrails?: None Help needed moving from lying on your back to sitting on the side of a flat bed without using bedrails?: None Help needed moving to and from a bed to a chair (including a wheelchair)?: A Little Help needed standing up from a chair using your arms (e.g., wheelchair or bedside chair)?: A Little Help needed to walk in hospital room?: A Little Help needed climbing 3-5 steps with a railing? : A Little 6 Click Score: 20    End of Session   Activity Tolerance: Patient limited by fatigue Patient left: in bed;with call bell/phone within reach;with bed alarm set   PT Visit Diagnosis: Unsteadiness on feet (R26.81);Muscle weakness (generalized) (M62.81);Pain Pain - part of body:  (BIL LEs)    Time: 0258-5277 PT Time Calculation (min) (ACUTE ONLY): 36 min   Charges:   PT Evaluation $PT Eval Low Complexity: 1 Low PT Treatments $Therapeutic Activity: 8-22 mins  Candie Mile, PT, DPT Physical Therapist Acute Rehabilitation Services [facility] Outpatient Rehabilitation Services [facility]   Ellouise Newer 07/30/2022, 9:55 AM

## 2022-07-30 NOTE — Progress Notes (Signed)
  Echocardiogram 2D Echocardiogram has been performed.  Mark Washington 07/30/2022, 11:37 AM

## 2022-07-30 NOTE — Progress Notes (Signed)
Internal Medicine Attending:   I saw and examined the patient. I reviewed the resident's H&P note and I agree with the resident's findings and plan as documented in the resident's note.  In brief, patient is a 37 year old male with Paschal history of hypertension since age of 52, tachycardia (questionable SVT), bipolar disorder/depression, asthma, HIV who presented to ED with chest pressure and dizziness x 1 day.  Patient states that the pressure is worse when moving around and improves when he rests.  He complains of associated episodes of lightheadedness and dizziness when changing positions.  He also complains of palpitations as well as dyspnea on exertion.  Patient states that he has not been able to eat in 4 days as he feels like food gets stuck in his esophagus and complained of associated nausea when he eats.  He also states he has not slept in 2 days prior to last night when he got 7-7 1/2 hours of sleep.  Patient was seen in the ED and was noted to have elevated blood pressures with systolics in the 578I to 696E and diastolics in the 952W.  Patient was initially treated with labetalol in the ED with slight improvement in blood pressure but given persistent symptoms and elevated blood pressures he was admitted to the hospital for possible hypertensive urgency.  Today, patient states that he still has episodes of lightheadedness when attempting to sit up and when changing positions with his head.  He states that the room is spinning when he attempts to do so.  States that normally his blood pressure is better controlled than this.  Vitals:   07/30/22 0748 07/30/22 0814  BP: (!) 174/118 (!) 163/106  Pulse: 80 82  Resp: 16 17  Temp: 98.2 F (36.8 C) 98.1 F (36.7 C)  SpO2: 99% 97%   On exam, patient is lying in bed in no apparent distress.  Lungs are clear to auscultation bilaterally.  Abdomen is soft, nontender, sounds normoactive bowel sounds.  Cardiovascular exam reveals regular rate and  rhythm with normal heart sounds.  Lower extremities are nontender to palpation with no edema noted.  Patient does appear anxious but no pressured speech today.  Assessment and plan:  1.  Chest pressure in the setting of uncontrolled hypertension: -Patient presented to ED with chest pressure and lightheadedness in the setting of uncontrolled hypertension with SBP's in the 180s to 190s on presentation.  Patient's systolic blood pressures have improved today to the 150s to 170s. -The etiology of the patient's chest pressure remains uncertain at this time but is likely secondary to his uncontrolled hypertension -There was concern initially for possible ACS especially in the setting of the family history of cardiac disease and uncontrolled hypertension.  However, patient's EKG showed no ischemic changes and his troponins were within normal limits making this less likely. -2D echo done today showed a normal EF of 60 to 65% with moderate concentric LVH but normal right ventricular systolic function and no significant valvular abnormalities -Will continue with hydrochlorothiazide 25 mg daily for his blood pressure control.  We have also started amlodipine 10 mg daily beginning today to help control his blood pressure.  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 -No further workup at this time  2.  Vertigo: -Patient states that when he changes  position he notices the room spinning.  The sensation lasts for a few seconds and then resolved.  Patient also states that he gets similar symptoms when he moves his head from side-to-side. 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  HIV: -Patient states that he is not on both Triumeq and  Biktarvy but on [place] alone and that he was taken off his Triumeq by his ID physician.  States that his HIV has been well-controlled on this medication -Patient to follow-up with his HIV clinic [place] next month -Will continue [place] for now -No further workup at this time

## 2022-07-30 NOTE — Progress Notes (Signed)
Subjective:   Summary: Mark Washington is a 37 y.o. year old male currently admitted on the IMTS HD#0 for severe asymptomatic hypertension.  Overnight Events: NAEON.  Pressures better overnight  Had an "episode" last night. Had a headache, got dizzy, noted that he had high blood pressure with SBP in the 170s. Describes "room spinning." Dizziness is associated with position changes. Is eager to get BP under control. Willing to increased medications. Feels comfortable laying down, but if he sits or stands up, he becomes symptomatic.  Of note, he does mention that he was previously treated for vertigo as a child.  Family history of kidney disease. Concerned about lack of appetite. Gets very nauseous with food. Feels like food is getting stuck in his throat. Was on dronabinol previously. Reports adherence to [facility].  He did sleep about 7 hours last night and feels much better today.  Objective:  Vital signs in last 24 hours: Vitals:   07/29/22 1930 07/29/22 2000 07/29/22 2107 07/30/22 0632  BP: (!) 156/99 (!) 140/103 (!) 152/117 (!) 156/108  Pulse: 80 83 69 74  Resp: 17 19 18 20   Temp:   98.3 F (36.8 C) 97.6 F (36.4 C)  TempSrc:   Oral Oral  SpO2: 96% 98% 100% 97%  Weight:      Height:       Supplemental O2: Room Air SpO2: 97 %   Physical Exam:  Constitutional: NAD Cardiovascular: regular rate and rhythm, no m/r/g Pulmonary/Chest: normal work of breathing on room air, lungs clear to auscultation bilaterally Abdominal: soft, non-tender, non-distended Neurological: alert & oriented x 3, 5/5 strength in bilateral upper and lower extremities, normal gait Psych: Appropriate speech, nervous affect but less so than yesterday  Filed Weights   07/28/22 2321  Weight: 89 kg     Intake/Output Summary (Last 24 hours) at 07/30/2022 0745 Last data filed at 07/30/2022 08/01/2022 Gross per 24 hour  Intake 1000 ml  Output 0 ml  Net 1000 ml   Net IO Since Admission:  1,000 mL [07/30/22 0745]  Pertinent Labs:    Latest Ref Rng & Units 07/30/2022    5:26 AM 07/28/2022   11:28 PM 02/07/2019    4:06 PM  CBC  WBC 4.0 - 10.5 K/uL 6.6  7.1  4.8   Hemoglobin 13.0 - 17.0 g/dL 04/09/2019  93.7  90.2   Hematocrit 39.0 - 52.0 % 52.3  49.1  44.7   Platelets 150 - 400 K/uL 167  178  142        Latest Ref Rng & Units 07/30/2022    5:26 AM 07/28/2022   11:28 PM 02/07/2019    4:06 PM  CMP  Glucose 70 - 99 mg/dL 04/09/2019  735  85   BUN 6 - 20 mg/dL 5  7  14    Creatinine 0.61 - 1.24 mg/dL 329   9.24   Sodium 135 - 145 mmol/L 136  135  139   Potassium 3.5 - 5.1 mmol/L 3.9  3.5  4.3   Chloride 98 - 111 mmol/L 99  100  103   CO2 22 - 32 mmol/L 29  25  28    Calcium 8.9 - 10.3 mg/dL 2.68  9.5  3.41   Total Protein 6.5 - 8.1 g/dL  8.0  7.4   Total Bilirubin 0.3 - 1.2 mg/dL  0.9  0.5   Alkaline Phos 38 -  126 U/L  66    AST 15 - 41 U/L  22  15   ALT 0 - 44 U/L  19  14    Lipase 30 COVID-negative, flu negative UDS positive for THC UA unremarkable D-dimer unremarkable Trops unremarkable  Imaging: CXR unremarkable for any acute or chronic process   EKG: personally reviewed my interpretation is NSR with inverted T waves in anterolateral leads   Assessment/Plan:   Principal Problem:   Asymptomatic hypertensive urgency   Patient Summary: Mark Washington is a 37 y.o. person living with a history of HIV, asthma, bipolar disorder, tachycardia, OSA, HTN who presented with chest discomfort, dizziness and admitted for severe asymptomatic hypertension on hospital day 0   Severe symptomatic hypertension History of possible SVT Uncontrolled hypertension History of OSA Dizziness with possible history of vertigo Patient with history of likely untreated hypertension, possible prior episodes of SVT, one of which he says required intervention likely with adenosine or flecainide, extensive family history of cardiac disease, cardiovascular events, diabetes, and renal failure  presenting with few days of worsening exertional intermittent chest discomfort and dizziness as well as prior history of syncope without prodrome.  He continues to not have chest pain during our assessment today.  Blood pressures are better today on current p.o. regimen.. Exam is overall unremarkable, though he does appear dizzy when changing positions.  He does have some T wave inversions on initial EKG.  Rest of initial workup is overall unremarkable.  Low concern for ACS at time of assessment, but he does have personal and family history which would require further investigation into cardiogenic etiologies of his symptoms. I suspect his anxiety and psychiatric conditions also likely playing a role in his current hypertension as well. - Orthostatics - renin/aldosterone ratio sent out, follow-up renal ultrasound - Of note, he had A1c of 5.8 last year and LDL of 155. Is not 40 yet, but ASCVD would qualify for moderate-high intensity statin if he were.  He additionally is HIV positive and treated with Biktarvy.  Will go ahead and initiate moderate intensity statin today and titrate outpatient as needed. - Follow-up echo.  If regional abnormalities, might consider cardiology follow-up and cardiac MRI. - Cardiac monitoring, consider outpatient monitor as well at discharge - Continue home HCTZ 25, restarted amlodipine 10.  Of note he does mention that he had prior reaction to ACE/ARB and that they were discontinued. - No PRNs unless severe HTN and symptomatic - CPAP overnight - If any of above workup return abnormal, would consider cardiology evaluation - PT/vestibular PT eval   HIV Note from July 2023 mentions well-controlled HIV on Triumeq and Biktarvy.  When questioned, he mentioned he does not take Triumeq but does take Biktarvy regularly.  Needs to follow-up again with HIV clinic at [facility].  No acute reason to further evaluate during this admission, do not think his HIV is related to his presentation. -  Continue Biktarvy   Asthma See albuterol and Symbicort on med list.  Not sure if he takes this regularly.  He mentioned he did try albuterol when he started experiencing dyspnea but it did not improve his symptoms.  No wheezing on exam and respiratory status stable on room air during initial evaluation.  No acute concerns at this time for asthma exacerbation.   Bipolar disorder Taking Wellbutrin 150, trazodone 50 nightly for what he mentions his depression.  Initial presentation yesterday concerning for hypomania, but he did sleep well last night and appears to have less  rapid speech today.  Wellbutrin would not be appropriate treatment if his true diagnosis was bipolar disorder.  Not acutely manic or psychotic, will have psychiatry evaluate nonemergently and change regimen. -Continue trazodone 50 mg nightly.  Holding Wellbutrin 150 until psychiatry evaluation.   Infrequent dysphagia EGD with normal findings in 2023.  No acute interventions needed.  Will follow-up if he continues to have dysphagia during this admission.  Possible globus sensation.  No workup indicated at this time, but will treat nausea as needed - Zofran as needed for nausea   Weakness Has had ongoing intermittent lower extremity weakness going on for a few years.  Has previously been evaluated by rheumatology and neurology.  He mentions this started during [place] hospitalization, but some of his visits were prior to that.  Notes report that this is likely functional. - PT/OT evaluation    Diet: Normal IVF: None VTE:  Xarelto Code: DNR PT/OT recs: Pending,. TOC recs:  Family Update:   Dispo: Anticipated discharge to Home in 1 days pending ongoing workup.   Linus Galas, MD PGY-1 Internal Medicine Resident Please contact the on call pager after 5 pm and on weekends at [phone].

## 2022-07-30 NOTE — Progress Notes (Signed)
Initial Nutrition Assessment  DOCUMENTATION CODES:   Obesity unspecified  INTERVENTION:  - Add MVI q day.   - Add [employer] Ess[employer] TID, each packet mixed with 8 ounces of 2% milk provides 13 grams of protein and 260 calories.   NUTRITION DIAGNOSIS:   Inadequate oral intake related to poor appetite as evidenced by per patient/family report.  GOAL:   Patient will meet greater than or equal to 90% of their needs  MONITOR:   PO intake  REASON FOR ASSESSMENT:   Malnutrition Screening Tool    ASSESSMENT:   37 y.o. male admits related to chest comfort and dizziness. PMH includes: HIV, asthma, bipolar disorder, tachycardia, OSA, HTN. Pt is currently receiving medical management related to severe symptomatic HTN.  Meds reviewed: senokot, miralax. Labs reviewed.   Pt reports that he has no appetite. He states that this has been going on for the past 2 years since he got Covid. He reports that he has not experienced any wt loss recently. No wt loss per record. RD will add CIB. Will also inquire about appetite stimulant. Will continue to monitor PO intakes.   NUTRITION - FOCUSED PHYSICAL EXAM:  WNL - no wasting noted.   Diet Order:   Diet Order             Diet regular Room service appropriate? Yes; Fluid consistency: Thin  Diet effective now                   EDUCATION NEEDS:   Not appropriate for education at this time  Skin:  Skin Assessment: Reviewed RN Assessment  Last BM:  07/28/22  Height:   Ht Readings from Last 1 Encounters:  07/28/22 5\' 4"  (1.626 m)    Weight:   Wt Readings from Last 1 Encounters:  07/28/22 89 kg    Ideal Body Weight:     BMI:  Body mass index is 33.68 kg/m.  Estimated Nutritional Needs:   Kcal:  2045-2225 kcals  Protein:  100-115 gm  Fluid:  >/= 2 L  Thalia Bloodgood, RD, LDN, CNSC.

## 2022-07-30 NOTE — Progress Notes (Signed)
Patient states he is in pain at a level 10. Requests stronger medication. Doctor on call notified, will come and assess patient.

## 2022-07-30 NOTE — Consult Note (Addendum)
[facility] New Face-to-Face Psychiatric Evaluation  Service Date: July 30, 2022 LOS:  LOS: 0 days   Assessment  Mark Washington is a 37 y.o. male admitted medically for 07/28/2022 11:16 PM for episode of dizziness, chest pressure. He carries the psychiatric diagnoses of bipolar/depression and has a past medical history of  hypertension (since age 89), tachycardia, asthma, and HIV. Consult / Liaison Psychiatry was consulted for manic / hypomanic symptoms by Dr. Linus Galas  from the internal medicine teaching service ([facility]).  The purpose of this interview was to investigate suspicions of manic / hypomanic symptoms. On initial evaluation, patient experiences currently and in the past a distinct period of abnormally and persistently elevated, expansive, and irritable mood and increased activity / energy lasting at least 4 consecutive days. During this time period, the patient also demonstrated decreased need for sleep, flight of ideas / subjective experience of racing thoughts, distractibility, increase in goal-directed activity / psychomotor agitation, and excessive involvement in activities that have high potential for painful consequences. This mood disturbance was NOT sufficiently severe enough to cause impairment in functioning, necessitate hospitalization to prevent harm to self / others, or have psychotic features present.. These symptoms were not attributable to any identifiable substances. He meets criteria for Bipolar II Disorder, current episode hypomanic     Patient also has separate complaints of poor sleep and poor appetite. He uses cannabis to stimulate appetite (we recognize his BMI is high but he insists on improving appetite). We will make changes to psychotropic for depression and sleep; will add another agent for mood stabilization.  these diagnoses are provisional diagnoses and subject to change as the patient's clinical picture evolves or new information  is revealed, including substances (drugs of abuse, medications), another medical condition, or better explained by another psychiatric diagnosis.  Diagnoses:  Active Hospital problems: Principal Problem:   Asymptomatic hypertensive urgency Active Problems:   Anxiety state   Tetrahydrocannabinol (THC) use disorder, moderate, dependence (HCC)   Bipolar II disorder, current episode hypomanic (HCC)   Insomnia   Lack of appetite    Plan  ## Safety and Observation Level:  - Based on my clinical evaluation, I estimate the patient to be at low risk of self harm in the current setting - At this time, we recommend a no additional level of observation. This decision is based on my review of the chart including patient's history and current presentation, interview of the patient, mental status examination, and consideration of suicide risk including evaluating suicidal ideation, plan, intent, suicidal or self-harm behaviors, risk factors, and protective factors. This judgment is based on our ability to directly address suicide risk, implement suicide prevention strategies and develop a safety plan while the patient is in the clinical setting. - Please contact our team if there is a concern that risk level has changed  ## Interventions (medications, psychoeducation, etc):  - start divalproex 500 mg ER once at bedtime as loading dose, then 250 mg ER daily at bedtime for mood stabilization - start mirtazepine 7.5 mg daily at bedtime for depressive symptoms with favorable side effects of sedation and increased appetite - changed trazodone 50 mg at bedtime to PRN at bedtime for insomnia in patients with depressive symptoms  - previously discontinued bupropion 150 mg ER  ## Medical Decision Making Capacity:  - Not formally assessed during this encounter  ## Further Work-up: - most recent EKG on 1/25 had QTc of 337 - pertinent labwork reviewed earlier this admission includes: chem  profile, CBC w/ diff,  blood and urine tox  - ordered VPA level and CMP recheck for 1/30  ## Disposition:  - No indication for inpatient psychiatric admission. Recommending outpatient psychiatry / psychology follow-up. Defer immediate disposition plan to primary team.  ## Behavioral / Environmental:  -- Standard delirium precautions and Fall precautions  ##Legal Status -- Patient voluntary  Thank you for this consult request. Our recommendations are listed above.  We will continue to follow patient's hospital course  Camelia Phenes, MD  NEW history  Relevant Aspects of Hospital Course:  Admitted on 07/28/2022 for episode of dizziness, chest pressure.  Patient Report:  Patient is life of the party. He frequently invites friends over but gets quiet when they actually get to his house - says his mood fluctuates a lot.  He says he uses cannabis to improve his appetite and says he was previously tried on dronabinol but was not approved by insurance. The longest time he has not had any sleep was 3 nights in a row because his body would not let him. During this time he is irritable and has very elevated mood, pacing the room, and has a lot of thoughts that he cannot control. He feels irritable right now and with elevated mood.  Past Psychiatric History per chart review / patient interview:  No psychiatric hospitalizations in the past  Social History per chart review / patient interview:  Drinks recreationally (drinks 2-3 shots once a week) Recreational cannabis use Smokes nicotine occasionally Denies all other drug use  Family History per chart review / patient interview:  Father reportedly has bipolar and was previously psychiatrically hospitalized. The patient's family history includes Anemia in his maternal grandmother; Arthritis in his maternal grandmother and paternal grandmother; Asthma in his mother; Breast cancer in his maternal grandmother; Diabetes in his father, maternal grandmother, mother, and paternal  grandfather; Heart attack in his paternal grandfather and paternal grandmother; Hyperlipidemia in his mother; Hypertension in his mother; Thyroid disease in his father.  Medical History: Past Medical History:  Diagnosis Date   Anxiety    Asthma    HIV (human immunodeficiency virus infection) ([place]) 01/25/2018   Hypertension    Lupus ([place])     Surgical History: History reviewed. No pertinent surgical history.  Medications:   Current Facility-Administered Medications:    acetaminophen (TYLENOL) tablet 650 mg, 650 mg, Oral, Q6H PRN, Linus Galas, MD, 650 mg at 07/30/22 1540   amLODipine (NORVASC) tablet 10 mg, 10 mg, Oral, Daily, Atway, Rayann N, DO, 10 mg at 07/30/22 1610   atorvastatin (LIPITOR) tablet 20 mg, 20 mg, Oral, Daily, Jodell Cipro, Sriramkumar, MD, 20 mg at 07/30/22 1540   bictegravir-emtricitabine-tenofovir AF (BIKTARVY) 50-200-25 MG per tablet 1 tablet, 1 tablet, Oral, Daily, Jodell Cipro, Sriramkumar, MD, 1 tablet at 07/30/22 1202   divalproex (DEPAKOTE ER) 24 hr tablet 500 mg, 500 mg, Oral, Once **FOLLOWED BY** [START ON 07/31/2022] divalproex (DEPAKOTE ER) 24 hr tablet 250 mg, 250 mg, Oral, QHS, Camelia Phenes, MD   hydrochlorothiazide (HYDRODIURIL) tablet 25 mg, 25 mg, Oral, Daily, Atway, Rayann N, DO, 25 mg at 07/30/22 0827   ipratropium-albuterol (DUONEB) 0.5-2.5 (3) MG/3ML nebulizer solution 3 mL, 3 mL, Inhalation, Q6H PRN, Camelia Phenes, MD   mirtazapine (REMERON) tablet 7.5 mg, 7.5 mg, Oral, QHS, Camelia Phenes, MD   mometasone-formoterol (DULERA) 100-5 MCG/ACT inhaler 2 puff, 2 puff, Inhalation, BID, Dareen Piano, Nischal, MD   multivitamin with minerals tablet 1 tablet, 1 tablet, Oral, Q supper, Aldine Contes, MD, 1 tablet  at 07/30/22 1805   ondansetron (ZOFRAN) 8 mg in sodium chloride 0.9 % 50 mL IVPB, 8 mg, Intravenous, Q6H PRN, Marrianne Mood, MD   polyethylene glycol (MIRALAX / GLYCOLAX) packet 17 g, 17 g, Oral, Daily, Marrianne Mood, MD   rivaroxaban  (XARELTO) tablet 10 mg, 10 mg, Oral, Daily, Atway, Rayann N, DO, 10 mg at 07/30/22 2355   senna (SENOKOT) tablet 17.2 mg, 2 tablet, Oral, QHS, Marrianne Mood, MD   traZODone (DESYREL) tablet 50 mg, 50 mg, Oral, QHS PRN, Augusto Gamble, MD  Allergies: Allergies  Allergen Reactions   Iodine Anaphylaxis   Shellfish Allergy Anaphylaxis   Oxycodone-Acetaminophen Hives, Itching and Rash    Objective  Vital signs:  Temp:  [97.6 F (36.4 C)-98.3 F (36.8 C)] 98.1 F (36.7 C) (01/26 1701) Pulse Rate:  [69-103] 103 (01/26 1701) Resp:  [16-20] 19 (01/26 1701) BP: (140-174)/(99-118) 149/113 (01/26 1701) SpO2:  [95 %-100 %] 95 % (01/26 1701)  Mental Status Exam:  Appearance and Grooming: Patient is casually dressed in t-shirt and joggers . The patient has no noticeable scent or odor.  Behavior: The patient appears in no acute distress, and during the interview, was distracted, required frequent redirection, and behaving appropriately to scenario. He was able to follow commands and compliant to requests and made good eye contact.  The patient did not appear internally or externally preoccupied.  Attitude: Patient's attitude towards the interviewer was cooperative, open, but showed a flare of irritability when asked to repeat an answer.  Motor activity: There was no notable abnormal facial movements and no notable abnormal extremity movements.  Speech: The volume of his speech was normal and normal in quantity. The rate was fast with a normal rhythm. Responses were normal in latency. There were no abnormal patterns in speech.  Mood: "I feel anxious about my blood pressure"  Affect: Patient's affect is irritable at times and animated with broad range and even fluctuations. His affect is appropriate for the topic of conversation. ------------------------------------------------------------------------------------------------------------------------- Perception The patient experiences  no hallucinations.  Thought Content The patient describes no delusional thoughts.  Patient at the time of interview denies active suicidal intent and denies passive suicidal ideation. He denies homicidal intent.  Thought Process The patient's thought process is linear and is goal-directed.  Insight The patient at the time of interview demonstrates good insight, as evidenced by understanding of mental health condition/s, acknowledgement of substance use disorder/s, and ability to identify trigger/s causing mental health decompensation.  Judgement The patient over the past 24 hours demonstrates good judgement, as evidenced by help-seeking behavior, such as requesting to start psychotropic medications and requesting outpatient resources, openness to starting psychotropic medications and adhering to psychotropic medication regimen, and engaging appropriately with staff / other patients.  Assets  Assets:Communication Skills; Desire for Improvement; Financial Resources/Insurance; Housing; Intimacy; Resilience; Social Support; Talents/Skills   Sleep  Sleep:Sleep: Poor   Physical Exam: General: well-appearing and in no acute distress HEENT: normocephalic and atraumatic Respiratory: normal respiratory effort and on RA Extremities: moving all extremities spontaneously  Blood pressure (!) 149/113, pulse (!) 103, temperature 98.1 F (36.7 C), temperature source Oral, resp. rate 19, height 5\' 4"  (1.626 m), weight 89 kg, SpO2 95 %. Body mass index is 33.68 kg/m.

## 2022-07-31 ENCOUNTER — Observation Stay (HOSPITAL_BASED_OUTPATIENT_CLINIC_OR_DEPARTMENT_OTHER): Payer: Medicaid Other

## 2022-07-31 DIAGNOSIS — I160 Hypertensive urgency: Secondary | ICD-10-CM | POA: Diagnosis not present

## 2022-07-31 LAB — BASIC METABOLIC PANEL
Anion gap: 13 (ref 5–15)
BUN: 10 mg/dL (ref 6–20)
CO2: 28 mmol/L (ref 22–32)
Calcium: 10.5 mg/dL — ABNORMAL HIGH (ref 8.9–10.3)
Chloride: 97 mmol/L — ABNORMAL LOW (ref 98–111)
Creatinine, Ser: 1.61 mg/dL — ABNORMAL HIGH (ref 0.61–1.24)
GFR, Estimated: 57 mL/min — ABNORMAL LOW (ref >60)
Glucose, Bld: 111 mg/dL — ABNORMAL HIGH (ref 70–99)
Potassium: 3.8 mmol/L (ref 3.5–5.1)
Sodium: 138 mmol/L (ref 135–145)

## 2022-07-31 MED ORDER — BICTEGRAVIR-EMTRICITAB-TENOFOV 50-200-25 MG PO TABS: 1.0000 | ORAL_TABLET | Freq: Every day | ORAL | Status: AC

## 2022-07-31 MED ORDER — MECLIZINE HCL 50 MG PO TABS: 50.0000 mg | ORAL_TABLET | Freq: Three times a day (TID) | ORAL | 0 refills | Status: AC | PRN

## 2022-07-31 MED ORDER — ATORVASTATIN CALCIUM 20 MG PO TABS: 20.0000 mg | ORAL_TABLET | Freq: Every day | ORAL | 1 refills | Status: AC

## 2022-07-31 MED ORDER — DIVALPROEX SODIUM ER 250 MG PO TB24: 250.0000 mg | ORAL_TABLET | Freq: Every day | ORAL | 1 refills | Status: AC

## 2022-07-31 MED ORDER — AMLODIPINE BESYLATE 10 MG PO TABS: 10.0000 mg | ORAL_TABLET | Freq: Every day | ORAL | 1 refills | Status: AC

## 2022-07-31 MED ORDER — MECLIZINE HCL 25 MG PO TABS
25.0000 mg | ORAL_TABLET | Freq: Once | ORAL | Status: AC
  Administered 2022-07-31: 25 mg via ORAL
  Filled 2022-07-31: qty 1

## 2022-07-31 MED ORDER — MIRTAZAPINE 7.5 MG PO TABS: 7.5000 mg | ORAL_TABLET | Freq: Every day | ORAL | 1 refills | Status: AC

## 2022-07-31 MED ORDER — TRAZODONE HCL 50 MG PO TABS: 50.0000 mg | ORAL_TABLET | Freq: Every evening | ORAL | 0 refills | Status: AC | PRN

## 2022-07-31 NOTE — Progress Notes (Signed)
Renal artery duplex has been completed.   Preliminary results in CV Proc.   Mark Washington Mark Washington 07/31/2022 11:24 AM

## 2022-07-31 NOTE — Progress Notes (Signed)
DISCHARGE NOTE HOME Mark Washington to be discharged Home per MD order. Discussed prescriptions and follow up appointments with the patient. Prescriptions given to patient; medication list explained in detail. Patient verbalized understanding.  Skin clean, dry and intact without evidence of skin break down, no evidence of skin tears noted. IV catheter discontinued intact. Site without signs and symptoms of complications. Dressing and pressure applied. Pt denies pain at the site currently. No complaints noted.  Patient free of lines, drains, and wounds.   An After Visit Summary (AVS) was printed and given to the patient. Patient escorted via wheelchair, and discharged home via private auto.  Keenan Bachelor, RN

## 2022-07-31 NOTE — TOC Progression Note (Signed)
Transition of Care [facility]) - Progression Note    Patient Details  Name: Mark Washington MRN: [id number] Date of Birth: [birth year]  [facility] CM/SW Contact  Bartholomew Crews, RN Phone Number: [phone] 07/31/2022, 1:38 PM  Clinical Narrative:     Spoke with patient at the bedside to discuss post acute transition. Patient stated that he may not be discharged today d/t changes in his bp/hr when he was up to get ready. Patient stated that he was told to get back in bed. Discussed referral to [facility] for cane - confirmed delivery. Discussed PCP at [facility] - advised to follow up and request referral to for outpatient PT. TOC following for transition needs.     Barriers to Discharge: Continued Medical Work up  Expected Discharge Plan and Services         Expected Discharge Date: 07/31/22                                     Social Determinants of Health (SDOH) Interventions SDOH Screenings   Food Insecurity: No Food Insecurity (07/30/2022)  Housing: Low Risk  (07/30/2022)  Transportation Needs: No Transportation Needs (07/30/2022)  Utilities: Not At Risk (07/30/2022)  Depression (PHQ2-9): Low Risk  (02/22/2019)  Tobacco Use: Low Risk  (07/28/2022)    Readmission Risk Interventions     No data to display

## 2022-07-31 NOTE — Progress Notes (Signed)
Physical Therapy Treatment Patient Details Name: Mark Washington MRN: [id number] DOB: [birth year] Today's Date: 07/31/2022   History of Present Illness Mark Washington is a 37 yo male presented to ED 1/24 after an episode of dizziness, chest pressure. PMHx: hypertension (since age 14), tachycardia, bipolar/depression, asthma, HIV.    PT Comments    Pt tolerated today's session well, able to progress ambulation and perform stair trial with use of SPC. Vestibular screening performed with no evidence of BPPV during today's session. Pt noted with central symptoms with ragged smooth pursuits and increased convergence point, however pt also reports symptoms with changes in position, specifically supine>sit and sit>stand, noted increase in BP with changes in position, consistent with pt's symptoms. Pt will continue to benefit from skilled acute PT at this time to continue to progress mobility and activity tolerance, OPPT discharge recommendation remains appropriate.     Recommendations for follow up therapy are one component of a multi-disciplinary discharge planning process, led by the attending physician.  Recommendations may be updated based on patient status, additional functional criteria and insurance authorization.  Follow Up Recommendations  Outpatient PT ([facility]))     Assistance Recommended at Discharge PRN  Patient can return home with the following Assist for transportation   Equipment Recommendations  [name] (Out of town, does not have his own cane here.)    Recommendations for Other Services       Precautions / Restrictions Precautions Precautions: Fall Restrictions Weight Bearing Restrictions: No     Mobility  Bed Mobility Overal bed mobility: Modified Independent             General bed mobility comments: extra time and use of bed rail    Transfers Overall transfer level: Needs assistance Equipment used: Straight cane Transfers: Sit to/from  Stand Sit to Stand: Supervision           General transfer comment: supervision provided for safety, pt with increased time changing positions    Ambulation/Gait Ambulation/Gait assistance: Supervision Gait Distance (Feet): 100 Feet Assistive device: Straight cane Gait Pattern/deviations: Step-through pattern Gait velocity: decreased     General Gait Details: pt with guarded gait, good management and sequencing with SPC   Stairs Stairs: Yes Stairs assistance: Supervision Stair Management: One rail Left, Forwards, With cane, Step to pattern Number of Stairs: 11 General stair comments: step to pattern for stair negotiation with increased time but stable and no LOB   Wheelchair Mobility    Modified Rankin (Stroke Patients Only)       Balance Overall balance assessment: Needs assistance Sitting-balance support: No upper extremity supported, Feet supported Sitting balance-Leahy Scale: Normal     Standing balance support: Single extremity supported, During functional activity Standing balance-Leahy Scale: Fair Standing balance comment: Pt utilizing SPC with standing static and dynamic activity, no LOB noted. Pt with one trial of standing for BP without AD, pt reaching for 1UE support for balance                            Cognition Arousal/Alertness: Awake/alert Behavior During Therapy: WFL for tasks assessed/performed Overall Cognitive Status: Within Functional Limits for tasks assessed                                          Exercises      General Comments General comments (skin  integrity, edema, etc.): Pt reports dizziness 8/10 at rest, increasing to 10/10 with vestibular screen. No nystagmus noted with Marye Round and Roll tests. Pt reports history of vertigo but reports symptoms aren't as severe. No double vision or ringing in ears. Pt reports increase in symptoms with changes in position, specifically supine>sit and sit>stand,  but occasionally with head turns. Symptoms decrease with laying down. Pt reports when symptoms occur they last a few hours. Noted ragged smooth pursuits with an increase in dizziness for pt, as well as increased convergence point and an increase in symptoms. Saccades WNL. Pt with one instance of catch-up saccade with head impulse test with L head turn, however no other instances so it may be due to concentration. Pt's BP in supine: 140/99(112) mmHg, sitting: 159/118(129) mmHg, standing: 159/105(123) mmHg.      Pertinent Vitals/Pain Pain Assessment Pain Assessment: No/denies pain    Home Living                          Prior Function            PT Goals (current goals can now be found in the care plan section) Acute Rehab PT Goals Patient Stated Goal: Get well PT Goal Formulation: With patient Time For Goal Achievement: 08/06/22 Potential to Achieve Goals: Good Progress towards PT goals: Progressing toward goals    Frequency    Min 3X/week      PT Plan Current plan remains appropriate    Co-evaluation              AM-PAC PT "6 Clicks" Mobility   Outcome Measure  Help needed turning from your back to your side while in a flat bed without using bedrails?: None Help needed moving from lying on your back to sitting on the side of a flat bed without using bedrails?: None Help needed moving to and from a bed to a chair (including a wheelchair)?: None Help needed standing up from a chair using your arms (e.g., wheelchair or bedside chair)?: None Help needed to walk in hospital room?: A Little Help needed climbing 3-5 steps with a railing? : A Little 6 Click Score: 22    End of Session Equipment Utilized During Treatment: Gait belt Activity Tolerance: Patient tolerated treatment well Patient left: in bed;with call bell/phone within reach Nurse Communication: Mobility status PT Visit Diagnosis: Unsteadiness on feet (R26.81);Muscle weakness (generalized)  (M62.81);Pain Pain - part of body:  (BIL LEs)     Time: 6269-4854 PT Time Calculation (min) (ACUTE ONLY): 34 min  Charges:  $Gait Training: 8-22 mins $Therapeutic Activity: 8-22 mins                     Charlynne Cousins, PT DPT Acute Rehabilitation Services Office [phone]    Luvenia Heller 07/31/2022, 1:59 PM

## 2022-07-31 NOTE — Discharge Summary (Signed)
Name: Mark [facility] MRN: [id number] DOB: 06/12/1986 37 y.o. PCP: Mark [facility]  Date of Admission: 07/28/2022 11:16 PM Date of Discharge: 07/31/2022  Attending Physician: Dr.  Ninetta [facility]  DISCHARGE DIAGNOSIS:  Primary Problem: Asymptomatic hypertensive urgency   Hospital Problems: Principal Problem:   Asymptomatic hypertensive urgency Active Problems:   Anxiety state   Tetrahydrocannabinol (THC) use disorder, moderate, dependence (HCC)   Bipolar II disorder, current episode hypomanic (HCC)   Insomnia   Lack of appetite    DISCHARGE MEDICATIONS:   Allergies as of 07/31/2022       Reactions   Iodine Anaphylaxis   Shellfish Allergy Anaphylaxis   Oxycodone-acetaminophen Hives, Itching, Rash        Medication List     STOP taking these medications    buPROPion 150 MG 24 hr tablet Commonly known as: WELLBUTRIN XL   DULoxetine HCl 40 MG Cpep   ibuprofen 200 MG tablet Commonly known as: ADVIL   meloxicam 15 MG tablet Commonly known as: MOBIC   methocarbamol 750 MG tablet Commonly known as: Robaxin-750   predniSONE 20 MG tablet Commonly known as: DELTASONE   Triumeq 600-50-300 MG tablet Generic drug: abacavir-dolutegravir-lamiVUDine       TAKE these medications    albuterol 108 (90 Base) MCG/ACT inhaler Commonly known as: VENTOLIN HFA Inhale 2 puffs into the lungs as needed for wheezing or shortness of breath.   amLODipine 10 MG tablet Commonly known as: NORVASC Take 1 tablet (10 mg total) by mouth daily. Start taking on: August 01, 2022   atorvastatin 20 MG tablet Commonly known as: LIPITOR Take 1 tablet (20 mg total) by mouth daily. Start taking on: August 01, 2022   bictegravir-emtricitabine-tenofovir AF 50-200-25 MG Tabs tablet Commonly known as: BIKTARVY Take 1 tablet by mouth daily. Start taking on: August 01, 2022   cyclobenzaprine 10 MG tablet Commonly known as: FLEXERIL Take 10 mg by mouth as needed for muscle spasms.    divalproex 250 MG 24 hr tablet Commonly known as: DEPAKOTE ER Take 1 tablet (250 mg total) by mouth at bedtime.   hydrochlorothiazide 25 MG tablet Commonly known as: HYDRODIURIL Take 25 mg by mouth daily.   Lyrica 50 MG capsule Generic drug: pregabalin Take 50 mg by mouth daily.   meclizine 50 MG tablet Commonly known as: ANTIVERT Take 1 tablet (50 mg total) by mouth 3 (three) times daily as needed for dizziness.   mirtazapine 7.5 MG tablet Commonly known as: REMERON Take 1 tablet (7.5 mg total) by mouth at bedtime.   ondansetron 4 MG disintegrating tablet Commonly known as: ZOFRAN-ODT Take 4 mg by mouth as needed for nausea or vomiting.   SUMAtriptan 50 MG tablet Commonly known as: IMITREX Take 1 tablet (50 mg total) by mouth once for 1 dose. What changed:  when to take this reasons to take this   Symbicort 160-4.5 MCG/ACT inhaler Generic drug: budesonide-formoterol Inhale 2 puffs into the lungs as needed (wheezing/SOB).   traZODone 50 MG tablet Commonly known as: DESYREL Take 1 tablet (50 mg total) by mouth at bedtime as needed for sleep. What changed:  when to take this reasons to take this               Durable Medical Equipment  (From admission, onward)           Start     Ordered   07/31/22 1223  DME Cane  Once        07/31/22 1225  DISPOSITION AND FOLLOW-UP:  Mr.Mark [facility] was discharged from [facility] in Stable condition. At the hospital follow up visit please address:  Hypertension: Discharged with amlodipine 10 mg and HCTZ 25 mg. May need additional antihypertensive therapy. Workup for secondary HTN started (neg renal ultrasound, TSH normal). Renin-aldo level pending at discharge. Follow up with PCP  Bipolar 2: Wellbutrin discontinued, started on depakote and mirtazapine. Needs to see his psychiatrist in outpatient setting  BPPV: Gave prn meclizine, vestibular rehab/neuro PT  recommended.  Follow-up Recommendations: Consults: none Labs: Basic Metabolic Profile Studies: none New Medications: Depakote, mirtazapine, amlodipine, prn meclizine  Follow-up Appointments:  Follow-up Information     [facility] Follow up.   Contact information: [address] [place] [address] [phone]               Advised to see PCP, psychiatrist, and ID   HOSPITAL COURSE:  Patient Summary: Severe symptomatic hypertension History of possible SVT Uncontrolled hypertension History of OSA Patient with history of likely untreated hypertension, possible prior episodes of SVT, one of which he says required intervention likely with adenosine, extensive family history of cardiac disease, cardiovascular events, diabetes, and renal failure presented with with asymptomatic severe hypertension which is resistant to initial stabilization efforts. He did have some T wave inversions on initial EKG. Trops negative. Low concern for ACS at time of assessment, but he does have personal and family history which would require further investigation into cardiogenic etiologies of his symptoms, thus echo was obtained. Echo revealed normal EF, no wall motion abnormalities, and normal LV diastolic parameters. TSH within normal limits, renin-aldo level pending. Of note, he had A1c of 5.8 last year and LDL of 155. Is not 40 yet, but ASCVD would qualify for moderate-high intensity statin if he were, thus was started on lipitor 20 mg daily. He was continued on his home HCZ 25 mg and started on amlodipine 10 mg daily for BP.  Of note he does mention that he had prior reaction to ACE/ARB and that they were discontinued. Renal dopplers were obtained given the patient's history of having hypertension at such a young age and this did not show any renal artery stenosis. BP was improved by the day of discharge, although still elevated. Suspect the patient may need  additional antihypertensive therapy in the outpatient setting.    HIV Patient states that he is not on both Triumeq and Biktarvy but on Biktarvy alone and that he was taken off his Triumeq by his ID physician.  States that his HIV has been well-controlled on this medication. Continued Biktarvy and patient will need to follow up with HIV clinic [facility] next month.   Vertigo: Patient states that when he changes position he notices the room spinning.  The sensation lasts for a few seconds and then resolved.  Patient also states that he gets similar symptoms when he moves his head from side-to-side. Gave meclizine which helped his symptoms and he is also recommended to get vestibular rehab in the outpatient setting.    Bipolar II disorder Taking Wellbutrin 150, trazodone 50 nightly for what he mentions his depression.  However, he does endorse sleeping very little for past 2-3 nights.  He also says he has had previous episodes of what seems like mania/hypomania. Wellbutrin was discontinued in the setting of new diagnosis of bipolar 2 and he was started on mirtazapine 7.5 mg qhs and depakote 250 mg. The patient was tolerating these well and  states that he likes how they make him feel. Advised patient to only take his trazodone if absolutely needed, due to risk of serotonin syndrome with his other medications. Also recommend he follow up with his psychiatrist in the outpatient setting.    Infrequent dysphagia EGD with normal findings in 2023.  No acute interventions needed.  Will follow-up if he continues to have dysphagia during this admission.  Possible globus sensation.   Weakness Has had ongoing intermittent lower extremity weakness going on for a few years.  Has previously been evaluated by rheumatology and neurology.  He mentions this started during [place] hospitalization, but some of his visits were prior to that.  Notes report that this is likely functional. He will be getting physical therapy in the  outpatient setting and DME order for cane was placed.    DISCHARGE INSTRUCTIONS:   Discharge Instructions     Ambulatory referral to Physical Therapy   Complete by: As directed    Vestibular rehab   Call [facility] for:  difficulty breathing, headache or visual disturbances   Complete by: As directed    Call [facility] for:  persistant dizziness or light-headedness   Complete by: As directed    Call [facility] for:  persistant nausea and vomiting   Complete by: As directed    Diet general   Complete by: As directed    Discharge instructions   Complete by: As directed    Dear Mr. Howton,  You were hospitalized for high blood pressure. We did an ultrasound of your heart to make sure you did not have a heart attack and that was normal. Please continue taking hydrochlorothiazide 25 mg daily for your blood pressure and start taking amlodipine 10 mg daily for your blood pressure as well.  We have also prescribed a medicine called lipitor/atorvastatin. Please take this everyday, as your cholesterol is high and with your family history, you are at high risk for having heart attacks in the future and this will help prevent that.  For your mood and appetite, STOP taking Wellbutrin and now you will be taking Mirtazapine and Depakote everyday. You will need to follow up with your psychiatrist and primary care doctors for this, but these are the new medicines for your mood and diagnosis of bipolar 2 disorder. You have been taking trazodone at home for sleep, but I would recommend not using this medication everyday and only using it if absolutely necessary, as this medicine can interact with the new medicines we have started. I would recommend you take the mirtazapine and depakote instead, as those can also help with sleep  For your dizziness, you will need to get physical therapy (vestibular rehab). I have sent in a prescription for meclizine, which you can take as needed for your dizziness.   If you are interested in  switching primary care providers and would like to see our clinic, please call [phone] to set up an appointment! It was a pleasure taking care of you and we are glad you are feeling better.   Increase activity slowly   Complete by: As directed        SUBJECTIVE:  The patient continues to have some dizziness when changing positions and is still having some burning sensation in his legs. Depakote and mirtazapine are working well for him and he wishes to go home with them. Appetite is also much better.   Discharge Vitals:   BP (!) 172/113 (BP Location: Right Arm)   Pulse (!) 125  Temp 98.7 F (37.1 C) (Oral)   Resp 16   Ht 5\' 4"  (1.626 m)   Wt 89 kg   SpO2 98%   BMI 33.68 kg/m   OBJECTIVE:  General: Pleasant, well-appearing male laying in bed. No acute distress. CV: RRR. No murmurs, rubs, or gallops. No LE edema Pulmonary: Lungs CTAB. Normal effort. No wheezing or rales. Abdominal: Soft, nontender, nondistended. Extremities: Normal bulk and tone Skin: Warm and dry.  Neuro: A&Ox3. Moves all extremities. No focal deficit. Psych: Normal mood and affect    Pertinent Labs, Studies, and Procedures:     Latest Ref Rng & Units 07/30/2022    5:26 AM 07/28/2022   11:28 PM 02/07/2019    4:06 PM  CBC  WBC 4.0 - 10.5 K/uL 6.6  7.1  4.8   Hemoglobin 13.0 - 17.0 g/dL 17.9  16.8  15.1   Hematocrit 39.0 - 52.0 % 52.3  49.1  44.7   Platelets 150 - 400 K/uL 167  178  142        Latest Ref Rng & Units 07/31/2022    3:44 AM 07/30/2022    5:26 AM 07/28/2022   11:28 PM  CMP  Glucose 70 - 99 mg/dL 111  122  104   BUN 6 - 20 mg/dL 10  5  7    Creatinine 0.61 - 1.24 mg/dL 1.61  1.40  1.38   Sodium 135 - 145 mmol/L 138  136  135   Potassium 3.5 - 5.1 mmol/L 3.8  3.9  3.5   Chloride 98 - 111 mmol/L 97  99  100   CO2 22 - 32 mmol/L 28  29  25    Calcium 8.9 - 10.3 mg/dL 10.5  10.0  9.5   Total Protein 6.5 - 8.1 g/dL   8.0   Total Bilirubin 0.3 - 1.2 mg/dL   0.9   Alkaline Phos 38 - 126 U/L    66   AST 15 - 41 U/L   22   ALT 0 - 44 U/L   19     DG Chest 2 View  Result Date: 07/28/2022 CLINICAL DATA:  Shortness of breath. EXAM: CHEST - 2 VIEW COMPARISON:  July 16, 2019 FINDINGS: The heart size and mediastinal contours are within normal limits. Both lungs are clear. The visualized skeletal structures are unremarkable. IMPRESSION: No active cardiopulmonary disease. Electronically Signed   By: Virgina Norfolk M.D.   On: 07/28/2022 23:48     Signed: Buddy Duty, D.O.  Internal Medicine Resident, PGY-2 [facility]  Pager: [phone] 3:22 PM, 07/31/2022

## 2022-08-05 LAB — ALDOSTERONE + RENIN ACTIVITY W/ RATIO
ALDO / PRA Ratio: 1.6 (ref 0.0–30.0)
Aldosterone: 2.1 ng/dL (ref 0.0–30.0)
PRA LC/MS/MS: 1.311 ng/mL/hr (ref 0.167–5.380)

## 2023-01-08 ENCOUNTER — Other Ambulatory Visit: Payer: Self-pay | Admitting: Internal Medicine
